# Patient Record
Sex: Female | Born: 1960 | Race: White | Hispanic: No | Marital: Married | Smoking: Never smoker
Health system: Southern US, Community
[De-identification: ages and names within clinical notes are randomized; demographics above are authoritative.]

## PROBLEM LIST (undated history)

## (undated) DIAGNOSIS — E785 Hyperlipidemia, unspecified: Secondary | ICD-10-CM

## (undated) DIAGNOSIS — M722 Plantar fascial fibromatosis: Secondary | ICD-10-CM

## (undated) DIAGNOSIS — E559 Vitamin D deficiency, unspecified: Secondary | ICD-10-CM

## (undated) HISTORY — DX: Plantar fascial fibromatosis: M72.2

## (undated) HISTORY — DX: Hyperlipidemia, unspecified: E78.5

## (undated) HISTORY — DX: Vitamin D deficiency, unspecified: E55.9

---

## 2015-10-10 ENCOUNTER — Encounter: Payer: Self-pay | Admitting: Physician Assistant

## 2015-10-10 DIAGNOSIS — Z0000 Encounter for general adult medical examination without abnormal findings: Secondary | ICD-10-CM | POA: Diagnosis not present

## 2015-10-10 DIAGNOSIS — Z01419 Encounter for gynecological examination (general) (routine) without abnormal findings: Secondary | ICD-10-CM | POA: Diagnosis not present

## 2015-10-26 DIAGNOSIS — Z713 Dietary counseling and surveillance: Secondary | ICD-10-CM | POA: Diagnosis not present

## 2016-01-25 DIAGNOSIS — Z713 Dietary counseling and surveillance: Secondary | ICD-10-CM | POA: Diagnosis not present

## 2016-03-21 DIAGNOSIS — Z713 Dietary counseling and surveillance: Secondary | ICD-10-CM | POA: Diagnosis not present

## 2016-04-24 DIAGNOSIS — H40053 Ocular hypertension, bilateral: Secondary | ICD-10-CM | POA: Diagnosis not present

## 2016-05-17 DIAGNOSIS — Z713 Dietary counseling and surveillance: Secondary | ICD-10-CM | POA: Diagnosis not present

## 2016-07-25 DIAGNOSIS — Z713 Dietary counseling and surveillance: Secondary | ICD-10-CM | POA: Diagnosis not present

## 2016-08-17 DIAGNOSIS — Z1231 Encounter for screening mammogram for malignant neoplasm of breast: Secondary | ICD-10-CM | POA: Diagnosis not present

## 2016-09-12 DIAGNOSIS — Z713 Dietary counseling and surveillance: Secondary | ICD-10-CM | POA: Diagnosis not present

## 2016-11-13 ENCOUNTER — Encounter (INDEPENDENT_AMBULATORY_CARE_PROVIDER_SITE_OTHER): Payer: Self-pay

## 2016-11-13 ENCOUNTER — Encounter: Payer: Self-pay | Admitting: Physician Assistant

## 2016-11-13 ENCOUNTER — Ambulatory Visit (INDEPENDENT_AMBULATORY_CARE_PROVIDER_SITE_OTHER): Payer: BLUE CROSS/BLUE SHIELD | Admitting: Physician Assistant

## 2016-11-13 VITALS — BP 140/93 | HR 76 | Temp 97.3°F | Ht 59.0 in | Wt 206.0 lb

## 2016-11-13 DIAGNOSIS — Z01419 Encounter for gynecological examination (general) (routine) without abnormal findings: Secondary | ICD-10-CM

## 2016-11-13 DIAGNOSIS — Z0000 Encounter for general adult medical examination without abnormal findings: Secondary | ICD-10-CM | POA: Diagnosis not present

## 2016-11-13 DIAGNOSIS — R635 Abnormal weight gain: Secondary | ICD-10-CM

## 2016-11-13 LAB — URINALYSIS, COMPLETE
Bilirubin, UA: NEGATIVE
GLUCOSE, UA: NEGATIVE
KETONES UA: NEGATIVE
LEUKOCYTES UA: NEGATIVE
Nitrite, UA: NEGATIVE
PROTEIN UA: NEGATIVE
SPEC GRAV UA: 1.015 (ref 1.005–1.030)
Urobilinogen, Ur: 0.2 mg/dL (ref 0.2–1.0)
pH, UA: 5.5 (ref 5.0–7.5)

## 2016-11-13 LAB — MICROSCOPIC EXAMINATION
Bacteria, UA: NONE SEEN
Renal Epithel, UA: NONE SEEN /hpf
WBC, UA: NONE SEEN /hpf (ref 0–?)

## 2016-11-13 LAB — BAYER DCA HB A1C WAIVED: HB A1C: 5.6 % (ref <7.0)

## 2016-11-13 MED ORDER — CETIRIZINE HCL 10 MG PO TABS: 10.0000 mg | ORAL_TABLET | Freq: Every day | ORAL | 12 refills | Status: DC

## 2016-11-13 MED ORDER — PREMPRO 0.625-5 MG PO TABS: 1.0000 | ORAL_TABLET | Freq: Every day | ORAL | 11 refills | Status: DC

## 2016-11-13 MED ORDER — VITAMIN D (ERGOCALCIFEROL) 1.25 MG (50000 UNIT) PO CAPS: 50000.0000 [IU] | ORAL_CAPSULE | ORAL | 12 refills | Status: DC

## 2016-11-13 MED ORDER — SIMVASTATIN 80 MG PO TABS: 80.0000 mg | ORAL_TABLET | Freq: Every evening | ORAL | 11 refills | Status: DC

## 2016-11-13 MED ORDER — PHENTERMINE HCL 37.5 MG PO CAPS: 37.5000 mg | ORAL_CAPSULE | ORAL | 2 refills | Status: DC

## 2016-11-13 NOTE — Patient Instructions (Signed)
Health Maintenance for Postmenopausal Women Menopause is a normal process in which your reproductive ability comes to an end. This process happens gradually over a span of months to years, usually between the ages of 22 and 9. Menopause is complete when you have missed 12 consecutive menstrual periods. It is important to talk with your health care provider about some of the most common conditions that affect postmenopausal women, such as heart disease, cancer, and bone loss (osteoporosis). Adopting a healthy lifestyle and getting preventive care can help to promote your health and wellness. Those actions can also lower your chances of developing some of these common conditions. What should I know about menopause? During menopause, you may experience a number of symptoms, such as:  Moderate-to-severe hot flashes.  Night sweats.  Decrease in sex drive.  Mood swings.  Headaches.  Tiredness.  Irritability.  Memory problems.  Insomnia.  Choosing to treat or not to treat menopausal changes is an individual decision that you make with your health care provider. What should I know about hormone replacement therapy and supplements? Hormone therapy products are effective for treating symptoms that are associated with menopause, such as hot flashes and night sweats. Hormone replacement carries certain risks, especially as you become older. If you are thinking about using estrogen or estrogen with progestin treatments, discuss the benefits and risks with your health care provider. What should I know about heart disease and stroke? Heart disease, heart attack, and stroke become more likely as you age. This may be due, in part, to the hormonal changes that your body experiences during menopause. These can affect how your body processes dietary fats, triglycerides, and cholesterol. Heart attack and stroke are both medical emergencies. There are many things that you can do to help prevent heart disease  and stroke:  Have your blood pressure checked at least every 1-2 years. High blood pressure causes heart disease and increases the risk of stroke.  If you are 53-22 years old, ask your health care provider if you should take aspirin to prevent a heart attack or a stroke.  Do not use any tobacco products, including cigarettes, chewing tobacco, or electronic cigarettes. If you need help quitting, ask your health care provider.  It is important to eat a healthy diet and maintain a healthy weight. ? Be sure to include plenty of vegetables, fruits, low-fat dairy products, and lean protein. ? Avoid eating foods that are high in solid fats, added sugars, or salt (sodium).  Get regular exercise. This is one of the most important things that you can do for your health. ? Try to exercise for at least 150 minutes each week. The type of exercise that you do should increase your heart rate and make you sweat. This is known as moderate-intensity exercise. ? Try to do strengthening exercises at least twice each week. Do these in addition to the moderate-intensity exercise.  Know your numbers.Ask your health care provider to check your cholesterol and your blood glucose. Continue to have your blood tested as directed by your health care provider.  What should I know about cancer screening? There are several types of cancer. Take the following steps to reduce your risk and to catch any cancer development as early as possible. Breast Cancer  Practice breast self-awareness. ? This means understanding how your breasts normally appear and feel. ? It also means doing regular breast self-exams. Let your health care provider know about any changes, no matter how small.  If you are 40  or older, have a clinician do a breast exam (clinical breast exam or CBE) every year. Depending on your age, family history, and medical history, it may be recommended that you also have a yearly breast X-ray (mammogram).  If you  have a family history of breast cancer, talk with your health care provider about genetic screening.  If you are at high risk for breast cancer, talk with your health care provider about having an MRI and a mammogram every year.  Breast cancer (BRCA) gene test is recommended for women who have family members with BRCA-related cancers. Results of the assessment will determine the need for genetic counseling and BRCA1 and for BRCA2 testing. BRCA-related cancers include these types: ? Breast. This occurs in males or females. ? Ovarian. ? Tubal. This may also be called fallopian tube cancer. ? Cancer of the abdominal or pelvic lining (peritoneal cancer). ? Prostate. ? Pancreatic.  Cervical, Uterine, and Ovarian Cancer Your health care provider may recommend that you be screened regularly for cancer of the pelvic organs. These include your ovaries, uterus, and vagina. This screening involves a pelvic exam, which includes checking for microscopic changes to the surface of your cervix (Pap test).  For women ages 21-65, health care providers may recommend a pelvic exam and a Pap test every three years. For women ages 79-65, they may recommend the Pap test and pelvic exam, combined with testing for human papilloma virus (HPV), every five years. Some types of HPV increase your risk of cervical cancer. Testing for HPV may also be done on women of any age who have unclear Pap test results.  Other health care providers may not recommend any screening for nonpregnant women who are considered low risk for pelvic cancer and have no symptoms. Ask your health care provider if a screening pelvic exam is right for you.  If you have had past treatment for cervical cancer or a condition that could lead to cancer, you need Pap tests and screening for cancer for at least 20 years after your treatment. If Pap tests have been discontinued for you, your risk factors (such as having a new sexual partner) need to be  reassessed to determine if you should start having screenings again. Some women have medical problems that increase the chance of getting cervical cancer. In these cases, your health care provider may recommend that you have screening and Pap tests more often.  If you have a family history of uterine cancer or ovarian cancer, talk with your health care provider about genetic screening.  If you have vaginal bleeding after reaching menopause, tell your health care provider.  There are currently no reliable tests available to screen for ovarian cancer.  Lung Cancer Lung cancer screening is recommended for adults 69-62 years old who are at high risk for lung cancer because of a history of smoking. A yearly low-dose CT scan of the lungs is recommended if you:  Currently smoke.  Have a history of at least 30 pack-years of smoking and you currently smoke or have quit within the past 15 years. A pack-year is smoking an average of one pack of cigarettes per day for one year.  Yearly screening should:  Continue until it has been 15 years since you quit.  Stop if you develop a health problem that would prevent you from having lung cancer treatment.  Colorectal Cancer  This type of cancer can be detected and can often be prevented.  Routine colorectal cancer screening usually begins at  age 42 and continues through age 45.  If you have risk factors for colon cancer, your health care provider may recommend that you be screened at an earlier age.  If you have a family history of colorectal cancer, talk with your health care provider about genetic screening.  Your health care provider may also recommend using home test kits to check for hidden blood in your stool.  A small camera at the end of a tube can be used to examine your colon directly (sigmoidoscopy or colonoscopy). This is done to check for the earliest forms of colorectal cancer.  Direct examination of the colon should be repeated every  5-10 years until age 71. However, if early forms of precancerous polyps or small growths are found or if you have a family history or genetic risk for colorectal cancer, you may need to be screened more often.  Skin Cancer  Check your skin from head to toe regularly.  Monitor any moles. Be sure to tell your health care provider: ? About any new moles or changes in moles, especially if there is a change in a mole's shape or color. ? If you have a mole that is larger than the size of a pencil eraser.  If any of your family members has a history of skin cancer, especially at a young age, talk with your health care provider about genetic screening.  Always use sunscreen. Apply sunscreen liberally and repeatedly throughout the day.  Whenever you are outside, protect yourself by wearing long sleeves, pants, a wide-brimmed hat, and sunglasses.  What should I know about osteoporosis? Osteoporosis is a condition in which bone destruction happens more quickly than new bone creation. After menopause, you may be at an increased risk for osteoporosis. To help prevent osteoporosis or the bone fractures that can happen because of osteoporosis, the following is recommended:  If you are 46-71 years old, get at least 1,000 mg of calcium and at least 600 mg of vitamin D per day.  If you are older than age 55 but younger than age 65, get at least 1,200 mg of calcium and at least 600 mg of vitamin D per day.  If you are older than age 54, get at least 1,200 mg of calcium and at least 800 mg of vitamin D per day.  Smoking and excessive alcohol intake increase the risk of osteoporosis. Eat foods that are rich in calcium and vitamin D, and do weight-bearing exercises several times each week as directed by your health care provider. What should I know about how menopause affects my mental health? Depression may occur at any age, but it is more common as you become older. Common symptoms of depression  include:  Low or sad mood.  Changes in sleep patterns.  Changes in appetite or eating patterns.  Feeling an overall lack of motivation or enjoyment of activities that you previously enjoyed.  Frequent crying spells.  Talk with your health care provider if you think that you are experiencing depression. What should I know about immunizations? It is important that you get and maintain your immunizations. These include:  Tetanus, diphtheria, and pertussis (Tdap) booster vaccine.  Influenza every year before the flu season begins.  Pneumonia vaccine.  Shingles vaccine.  Your health care provider may also recommend other immunizations. This information is not intended to replace advice given to you by your health care provider. Make sure you discuss any questions you have with your health care provider. Document Released: 07/13/2005  Document Revised: 12/09/2015 Document Reviewed: 02/22/2015 Elsevier Interactive Patient Education  2018 [employer]  

## 2016-11-14 LAB — CMP14+EGFR
A/G RATIO: 1.8 (ref 1.2–2.2)
ALBUMIN: 4.2 g/dL (ref 3.5–5.5)
ALT: 17 IU/L (ref 0–32)
AST: 18 IU/L (ref 0–40)
Alkaline Phosphatase: 91 IU/L (ref 39–117)
BILIRUBIN TOTAL: 0.3 mg/dL (ref 0.0–1.2)
BUN / CREAT RATIO: 15 (ref 9–23)
BUN: 12 mg/dL (ref 6–24)
CHLORIDE: 103 mmol/L (ref 96–106)
CO2: 24 mmol/L (ref 20–29)
Calcium: 9.4 mg/dL (ref 8.7–10.2)
Creatinine, Ser: 0.8 mg/dL (ref 0.57–1.00)
GFR calc non Af Amer: 84 mL/min/{1.73_m2} (ref >59)
GFR, EST AFRICAN AMERICAN: 97 mL/min/{1.73_m2} (ref >59)
Globulin, Total: 2.4 g/dL (ref 1.5–4.5)
Glucose: 97 mg/dL (ref 65–99)
POTASSIUM: 4.5 mmol/L (ref 3.5–5.2)
Sodium: 142 mmol/L (ref 134–144)
TOTAL PROTEIN: 6.6 g/dL (ref 6.0–8.5)

## 2016-11-14 LAB — LIPID PANEL
Chol/HDL Ratio: 4.9 ratio — ABNORMAL HIGH (ref 0.0–4.4)
Cholesterol, Total: 202 mg/dL — ABNORMAL HIGH (ref 100–199)
HDL: 41 mg/dL (ref >39)
LDL Calculated: 109 mg/dL — ABNORMAL HIGH (ref 0–99)
Triglycerides: 259 mg/dL — ABNORMAL HIGH (ref 0–149)
VLDL Cholesterol Cal: 52 mg/dL — ABNORMAL HIGH (ref 5–40)

## 2016-11-14 LAB — PAP IG W/ RFLX HPV ASCU: PAP SMEAR COMMENT: 0

## 2016-11-14 LAB — CBC WITH DIFFERENTIAL/PLATELET
BASOS: 0 %
Basophils Absolute: 0 10*3/uL (ref 0.0–0.2)
EOS (ABSOLUTE): 0.2 10*3/uL (ref 0.0–0.4)
Eos: 2 %
Hematocrit: 39.9 % (ref 34.0–46.6)
Hemoglobin: 13.5 g/dL (ref 11.1–15.9)
IMMATURE GRANS (ABS): 0 10*3/uL (ref 0.0–0.1)
Immature Granulocytes: 0 %
LYMPHS ABS: 2.1 10*3/uL (ref 0.7–3.1)
LYMPHS: 33 %
MCH: 30.1 pg (ref 26.6–33.0)
MCHC: 33.8 g/dL (ref 31.5–35.7)
MCV: 89 fL (ref 79–97)
Monocytes Absolute: 0.5 10*3/uL (ref 0.1–0.9)
Monocytes: 8 %
NEUTROS ABS: 3.6 10*3/uL (ref 1.4–7.0)
Neutrophils: 57 %
PLATELETS: 272 10*3/uL (ref 150–379)
RBC: 4.49 x10E6/uL (ref 3.77–5.28)
RDW: 13.6 % (ref 12.3–15.4)
WBC: 6.4 10*3/uL (ref 3.4–10.8)

## 2016-11-14 LAB — TSH: TSH: 2.16 u[IU]/mL (ref 0.450–4.500)

## 2016-11-14 NOTE — Progress Notes (Signed)
BP (!) 140/93   Pulse 76   Temp 97.3 F (36.3 C) (Oral)   Ht _0  (1.499 m)   Wt 206 lb (93.4 kg)   BMI 41.61 kg/m    Subjective:    Patient ID: Crystal Hall, female    DOB: 01-Sep-1960, 56 y.o.   MRN: [id number]  HPI: Bonny Vanleeuwen is a 56 y.o. female presenting on 11/13/2016 for Annual Exam  This patient comes in for Annual wellness visit and recheck on medications and conditions including She comes in for an annual exam. She needs a complete physical performed today with Pap smear. She also like her medications refilled and she has had some weight gain..   All medications are reviewed today. There are no reports of any problems with the medications. All of the medical conditions are reviewed and updated.  Lab work is reviewed and will be ordered as medically necessary. There are no new problems reported with today's visit.   Relevant past medical, surgical, family and social history reviewed and updated as indicated. Allergies and medications reviewed and updated.  No past medical history on file.  No past surgical history on file.  Review of Systems  Constitutional: Negative.  Negative for activity change, fatigue and fever.  HENT: Negative.   Eyes: Negative.   Respiratory: Negative.  Negative for cough.   Cardiovascular: Negative.  Negative for chest pain.  Gastrointestinal: Negative.  Negative for abdominal pain.  Endocrine: Negative.   Genitourinary: Negative.  Negative for dysuria.  Musculoskeletal: Negative.   Skin: Negative.   Neurological: Negative.     Allergies as of 11/13/2016   No Known Allergies     Medication List       Accurate as of 11/13/16 11:59 PM. Always use your most recent med list.          cetirizine 10 MG tablet Commonly known as:  ZYRTEC Take 1 tablet (10 mg total) by mouth daily.   phentermine 37.5 MG capsule Take 1 capsule (37.5 mg total) by mouth every morning.   PREMPRO 0.625-5 MG tablet Generic drug:  estrogen  (conjugated)-medroxyprogesterone Take 1 tablet by mouth daily.   simvastatin 80 MG tablet Commonly known as:  ZOCOR Take 1 tablet (80 mg total) by mouth every evening.   Vitamin D (Ergocalciferol) 50000 units Caps capsule Commonly known as:  DRISDOL Take 1 capsule (50,000 Units total) by mouth every 7 (seven) days.          Objective:    BP (!) 140/93   Pulse 76   Temp 97.3 F (36.3 C) (Oral)   Ht _1  (1.499 m)   Wt 206 lb (93.4 kg)   BMI 41.61 kg/m   No Known Allergies  Physical Exam  Constitutional: She is oriented to person, place, and time. She appears well-developed and well-nourished.  HENT:  Head: Normocephalic and atraumatic.  Eyes: Conjunctivae and EOM are normal. Pupils are equal, round, and reactive to light.  Neck: Normal range of motion. Neck supple.  Cardiovascular: Normal rate, regular rhythm, normal heart sounds and intact distal pulses.   Pulmonary/Chest: Effort normal and breath sounds normal. Right breast exhibits no mass, no skin change and no tenderness. Left breast exhibits no mass, no skin change and no tenderness. Breasts are symmetrical.  Abdominal: Soft. Bowel sounds are normal.  Genitourinary: Vagina normal and uterus normal. Rectal exam shows no fissure. No breast swelling, tenderness, discharge or bleeding. There is no tenderness or lesion on the right  labia. There is no tenderness or lesion on the left labia. Uterus is not deviated, not enlarged and not tender. Cervix exhibits no motion tenderness, no discharge and no friability. Right adnexum displays no mass, no tenderness and no fullness. Left adnexum displays no mass, no tenderness and no fullness. No tenderness or bleeding in the vagina. No vaginal discharge found.  Neurological: She is alert and oriented to person, place, and time. She has normal reflexes.  Skin: Skin is warm and dry. No rash noted.  Psychiatric: She has a normal mood and affect. Her behavior is normal. Judgment and  thought content normal.    Results for orders placed or performed in visit on 11/13/16  Microscopic Examination  Result Value Ref Range   WBC, UA None seen 0 - 5 /hpf   RBC, UA 0-2 0 - 2 /hpf   Epithelial Cells (non renal) 0-10 0 - 10 /hpf   Renal Epithel, UA None seen None seen /hpf   Bacteria, UA None seen None seen/Few  Urinalysis, Complete  Result Value Ref Range   Specific Gravity, UA 1.015 1.005 - 1.030   pH, UA 5.5 5.0 - 7.5   Color, UA Yellow Yellow   Appearance Ur Clear Clear   Leukocytes, UA Negative Negative   Protein, UA Negative Negative/Trace   Glucose, UA Negative Negative   Ketones, UA Negative Negative   RBC, UA Trace (A) Negative   Bilirubin, UA Negative Negative   Urobilinogen, Ur 0.2 0.2 - 1.0 mg/dL   Nitrite, UA Negative Negative   Microscopic Examination See below:   CBC with Differential/Platelet  Result Value Ref Range   WBC 6.4 3.4 - 10.8 x10E3/uL   RBC 4.49 3.77 - 5.28 x10E6/uL   Hemoglobin 13.5 11.1 - 15.9 g/dL   Hematocrit 39.9 34.0 - 46.6 %   MCV 89 79 - 97 fL   MCH 30.1 26.6 - 33.0 pg   MCHC 33.8 31.5 - 35.7 g/dL   RDW 13.6 12.3 - 15.4 %   Platelets 272 150 - 379 x10E3/uL   Neutrophils 57 Not Estab. %   Lymphs 33 Not Estab. %   Monocytes 8 Not Estab. %   Eos 2 Not Estab. %   Basos 0 Not Estab. %   Neutrophils Absolute 3.6 1.4 - 7.0 x10E3/uL   Lymphocytes Absolute 2.1 0.7 - 3.1 x10E3/uL   Monocytes Absolute 0.5 0.1 - 0.9 x10E3/uL   EOS (ABSOLUTE) 0.2 0.0 - 0.4 x10E3/uL   Basophils Absolute 0.0 0.0 - 0.2 x10E3/uL   Immature Granulocytes 0 Not Estab. %   Immature Grans (Abs) 0.0 0.0 - 0.1 x10E3/uL  CMP14+EGFR  Result Value Ref Range   Glucose 97 65 - 99 mg/dL   BUN 12 6 - 24 mg/dL   Creatinine, Ser 0.80 0.57 - 1.00 mg/dL   GFR calc non Af Amer 84 >59 mL/min/1.73   GFR calc Af Amer 97 >59 mL/min/1.73   BUN/Creatinine Ratio 15 9 - 23   Sodium 142 134 - 144 mmol/L   Potassium 4.5 3.5 - 5.2 mmol/L   Chloride 103 96 - 106 mmol/L   CO2  24 20 - 29 mmol/L   Calcium 9.4 8.7 - 10.2 mg/dL   Total Protein 6.6 6.0 - 8.5 g/dL   Albumin 4.2 3.5 - 5.5 g/dL   Globulin, Total 2.4 1.5 - 4.5 g/dL   Albumin/Globulin Ratio 1.8 1.2 - 2.2   Bilirubin Total 0.3 0.0 - 1.2 mg/dL   Alkaline Phosphatase 91 39 -  117 IU/L   AST 18 0 - 40 IU/L   ALT 17 0 - 32 IU/L  Lipid panel  Result Value Ref Range   Cholesterol, Total 202 (H) 100 - 199 mg/dL   Triglycerides 259 (H) 0 - 149 mg/dL   HDL 41 >39 mg/dL   VLDL Cholesterol Cal 52 (H) 5 - 40 mg/dL   LDL Calculated 109 (H) 0 - 99 mg/dL   Chol/HDL Ratio 4.9 (H) 0.0 - 4.4 ratio  TSH  Result Value Ref Range   TSH 2.160 0.450 - 4.500 uIU/mL  Bayer DCA Hb A1c Waived  Result Value Ref Range   Bayer DCA Hb A1c Waived 5.6 <7.0 %      Assessment & Plan:   1. Annual physical exam - Urinalysis, Complete - CBC with Differential/Platelet - CMP14+EGFR - Lipid panel - TSH - Bayer DCA Hb A1c Waived - Pap IG w/ reflex to HPV when [facility])  2. Well female exam with routine gynecological exam - Pap IG w/ reflex to HPV when [facility])  3. Weight gain - phentermine 37.5 MG capsule; Take 1 capsule (37.5 mg total) by mouth every morning.  Dispense: 30 capsule; Refill: 2   Current Outpatient Prescriptions:  .  cetirizine (ZYRTEC) 10 MG tablet, Take 1 tablet (10 mg total) by mouth daily., Disp: 30 tablet, Rfl: 12 .  phentermine 37.5 MG capsule, Take 1 capsule (37.5 mg total) by mouth every morning., Disp: 30 capsule, Rfl: 2 .  PREMPRO 0.625-5 MG tablet, Take 1 tablet by mouth daily., Disp: 30 tablet, Rfl: 11 .  simvastatin (ZOCOR) 80 MG tablet, Take 1 tablet (80 mg total) by mouth every evening., Disp: 30 tablet, Rfl: 11 .  Vitamin D, Ergocalciferol, (DRISDOL) 50000 units CAPS capsule, Take 1 capsule (50,000 Units total) by mouth every 7 (seven) days., Disp: 4 capsule, Rfl: 12  Continue all other maintenance medications as listed above.  Follow up plan: Return in about 1  year (around 11/13/2017) for recheck.  Educational handout given for health maintenance  Terald Sleeper PA-C [place] [address]  [place], [place] [address] [phone]   11/14/2016, 1:54 PM

## 2016-12-12 DIAGNOSIS — Z713 Dietary counseling and surveillance: Secondary | ICD-10-CM | POA: Diagnosis not present

## 2017-02-13 ENCOUNTER — Other Ambulatory Visit: Payer: Self-pay | Admitting: Physician Assistant

## 2017-02-13 NOTE — Telephone Encounter (Signed)
Rx phoned in.   

## 2017-02-20 DIAGNOSIS — Z713 Dietary counseling and surveillance: Secondary | ICD-10-CM | POA: Diagnosis not present

## 2017-04-10 DIAGNOSIS — Z713 Dietary counseling and surveillance: Secondary | ICD-10-CM | POA: Diagnosis not present

## 2017-04-12 ENCOUNTER — Other Ambulatory Visit: Payer: Self-pay | Admitting: Physician Assistant

## 2017-04-12 NOTE — Telephone Encounter (Signed)
rx called into pharmacy

## 2017-04-12 NOTE — Telephone Encounter (Signed)
Last seen 11/13/16  Lawanna KobusAngel  If approved [facility]  [phone]

## 2017-06-16 DIAGNOSIS — Z79899 Other long term (current) drug therapy: Secondary | ICD-10-CM | POA: Diagnosis not present

## 2017-06-16 DIAGNOSIS — M25512 Pain in left shoulder: Secondary | ICD-10-CM | POA: Diagnosis not present

## 2017-06-16 DIAGNOSIS — F17200 Nicotine dependence, unspecified, uncomplicated: Secondary | ICD-10-CM | POA: Diagnosis not present

## 2017-06-16 DIAGNOSIS — M19012 Primary osteoarthritis, left shoulder: Secondary | ICD-10-CM | POA: Diagnosis not present

## 2017-07-01 ENCOUNTER — Other Ambulatory Visit: Payer: Self-pay | Admitting: Physician Assistant

## 2017-07-01 NOTE — Telephone Encounter (Signed)
Last seen 11/13/16  angel

## 2017-07-31 DIAGNOSIS — Z713 Dietary counseling and surveillance: Secondary | ICD-10-CM | POA: Diagnosis not present

## 2017-08-20 ENCOUNTER — Encounter: Payer: Self-pay | Admitting: Physician Assistant

## 2017-08-20 ENCOUNTER — Ambulatory Visit: Payer: BLUE CROSS/BLUE SHIELD | Admitting: Physician Assistant

## 2017-08-20 VITALS — BP 137/82 | HR 94 | Temp 97.5°F | Ht 59.0 in | Wt 210.0 lb

## 2017-08-20 DIAGNOSIS — L2389 Allergic contact dermatitis due to other agents: Secondary | ICD-10-CM

## 2017-08-20 MED ORDER — METHYLPREDNISOLONE ACETATE 80 MG/ML IJ SUSP
80.0000 mg | Freq: Once | INTRAMUSCULAR | Status: AC
  Administered 2017-08-20: 80 mg via INTRAMUSCULAR

## 2017-08-20 MED ORDER — PREDNISONE 10 MG (21) PO TBPK: ORAL_TABLET | ORAL | 0 refills | Status: DC

## 2017-08-20 NOTE — Patient Instructions (Signed)
Contact Dermatitis Dermatitis is redness, soreness, and swelling (inflammation) of the skin. Contact dermatitis is a reaction to certain substances that touch the skin. You either touched something that irritated your skin, or you have allergies to something you touched. Follow these instructions at home: Skin Care  Moisturize your skin as needed.  Apply cool compresses to the affected areas.  Try taking a bath with: ? Epsom salts. Follow the instructions on the package. You can get these at a pharmacy or grocery store. ? Baking soda. Pour a small amount into the bath as told by your doctor. ? Colloidal oatmeal. Follow the instructions on the package. You can get this at a pharmacy or grocery store.  Try applying baking soda paste to your skin. Stir water into baking soda until it looks like paste.  Do not scratch your skin.  Bathe less often.  Bathe in lukewarm water. Avoid using hot water. Medicines  Take or apply over-the-counter and prescription medicines only as told by your doctor.  If you were prescribed an antibiotic medicine, take or apply your antibiotic as told by your doctor. Do not stop taking the antibiotic even if your condition starts to get better. General instructions  Keep all follow-up visits as told by your doctor. This is important.  Avoid the substance that caused your reaction. If you do not know what caused it, keep a journal to try to track what caused it. Write down: ? What you eat. ? What cosmetic products you use. ? What you drink. ? What you wear in the affected area. This includes jewelry.  If you were given a bandage (dressing), take care of it as told by your doctor. This includes when to change and remove it. Contact a doctor if:  You do not get better with treatment.  Your condition gets worse.  You have signs of infection such as: ? Swelling. ? Tenderness. ? Redness. ? Soreness. ? Warmth.  You have a fever.  You have new  symptoms. Get help right away if:  You have a very bad headache.  You have neck pain.  Your neck is stiff.  You throw up (vomit).  You feel very sleepy.  You see red streaks coming from the affected area.  Your bone or joint underneath the affected area becomes painful after the skin has healed.  The affected area turns darker.  You have trouble breathing. This information is not intended to replace advice given to you by your health care provider. Make sure you discuss any questions you have with your health care provider. Document Released: 03/18/2009 Document Revised: 10/27/2015 Document Reviewed: 10/06/2014 Elsevier Interactive Patient Education  2018 [employer].  

## 2017-08-21 NOTE — Progress Notes (Signed)
BP 137/82   Pulse 94   Temp (!) 97.5 F (36.4 C) (Oral)   Ht 4\' 11"  (1.499 m)   Wt 210 lb (95.3 kg)   BMI 42.41 kg/m    Subjective:    Patient ID: Crystal Hall, female    DOB: [birth year], 57 y.o.   MRN: [id number]  HPI: Crystal Hall is a 57 y.o. female presenting on 08/20/2017 for Rash  Over the weekend patient was outside in no she was exposed to poison him.  She has had severe reactions in the past.  She has a good number of linear areas on her right arm and some on her upper leg.  She denies any fever chills she is not having any swelling of her face or throat.  History reviewed. No pertinent past medical history. Relevant past medical, surgical, family and social history reviewed and updated as indicated. Interim medical history since our last visit reviewed. Allergies and medications reviewed and updated. DATA REVIEWED: CHART IN EPIC  Family History reviewed for pertinent findings.  Review of Systems  Constitutional: Negative.   HENT: Negative.   Eyes: Negative.   Respiratory: Negative.   Gastrointestinal: Negative.   Genitourinary: Negative.     Allergies as of 08/20/2017   No Known Allergies     Medication List        Accurate as of 08/20/17 11:59 PM. Always use your most recent med list.          cetirizine 10 MG tablet Commonly known as:  ZYRTEC Take 1 tablet (10 mg total) by mouth daily.   omega-3 acid ethyl esters 1 g capsule Commonly known as:  LOVAZA TAKE TWO CAPSULES BY MOUTH TWICE DAILY   predniSONE 10 MG (21) Tbpk tablet Commonly known as:  STERAPRED UNI-PAK 21 TAB As directed x 6 days   PREMPRO 0.625-5 MG tablet Generic drug:  estrogen (conjugated)-medroxyprogesterone Take 1 tablet by mouth daily.   simvastatin 80 MG tablet Commonly known as:  ZOCOR Take 1 tablet (80 mg total) by mouth every evening.   Vitamin D (Ergocalciferol) 50000 units Caps capsule Commonly known as:  DRISDOL Take 1 capsule (50,000 Units total) by mouth every 7  (seven) days.          Objective:    BP 137/82   Pulse 94   Temp (!) 97.5 F (36.4 C) (Oral)   Ht 4\' 11"  (1.499 m)   Wt 210 lb (95.3 kg)   BMI 42.41 kg/m   No Known Allergies  Wt Readings from Last 3 Encounters:  08/20/17 210 lb (95.3 kg)  11/13/16 206 lb (93.4 kg)    Physical Exam  Constitutional: She is oriented to person, place, and time. She appears well-developed and well-nourished.  HENT:  Head: Normocephalic and atraumatic.  Eyes: Conjunctivae and EOM are normal. Pupils are equal, round, and reactive to light.  Cardiovascular: Normal rate, regular rhythm, normal heart sounds and intact distal pulses.  Pulmonary/Chest: Effort normal and breath sounds normal.  Abdominal: Soft. Bowel sounds are normal.  Neurological: She is alert and oriented to person, place, and time. She has normal reflexes.  Skin: Skin is warm and dry. No rash noted.  Linear configurations of blisters on a red base.  Excoriation.  Psychiatric: She has a normal mood and affect. Her behavior is normal. Judgment and thought content normal.        Assessment & Plan:   1. 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  Follow up plan: Return if symptoms worsen or fail to improve.  Educational handout given for survey  Remus LofflerAngel S. Diondre Pulis PA-C [facility] [address]  [place], [place] [id number] [phone]   08/21/2017, 4:07 PM

## 2017-10-09 DIAGNOSIS — Z713 Dietary counseling and surveillance: Secondary | ICD-10-CM | POA: Diagnosis not present

## 2017-10-22 ENCOUNTER — Telehealth: Payer: Self-pay | Admitting: Physician Assistant

## 2017-10-30 ENCOUNTER — Telehealth: Payer: Self-pay | Admitting: Physician Assistant

## 2017-10-30 MED ORDER — FLUCONAZOLE 150 MG PO TABS: 150.0000 mg | ORAL_TABLET | Freq: Once | ORAL | 0 refills | Status: AC

## 2017-10-30 NOTE — Telephone Encounter (Signed)
RX sent to pharmacy per pt request Okayed per Dr Dettinger Left message on pt's vm

## 2017-10-30 NOTE — Telephone Encounter (Signed)
Go ahead and send Diflucan 150 mg x 1 0 refill

## 2017-11-15 ENCOUNTER — Other Ambulatory Visit: Payer: Self-pay | Admitting: Physician Assistant

## 2017-11-18 ENCOUNTER — Other Ambulatory Visit: Payer: Self-pay | Admitting: Physician Assistant

## 2017-11-27 ENCOUNTER — Other Ambulatory Visit: Payer: Self-pay | Admitting: Physician Assistant

## 2017-11-27 ENCOUNTER — Telehealth: Payer: Self-pay | Admitting: Physician Assistant

## 2017-11-27 DIAGNOSIS — Z0000 Encounter for general adult medical examination without abnormal findings: Secondary | ICD-10-CM

## 2017-11-27 NOTE — Telephone Encounter (Signed)
Orders are ready

## 2017-11-29 ENCOUNTER — Ambulatory Visit (INDEPENDENT_AMBULATORY_CARE_PROVIDER_SITE_OTHER): Payer: BLUE CROSS/BLUE SHIELD | Admitting: Physician Assistant

## 2017-11-29 ENCOUNTER — Encounter: Payer: Self-pay | Admitting: Physician Assistant

## 2017-11-29 VITALS — BP 139/82 | HR 87 | Temp 97.6°F | Ht 59.0 in | Wt 214.4 lb

## 2017-11-29 DIAGNOSIS — R635 Abnormal weight gain: Secondary | ICD-10-CM

## 2017-11-29 DIAGNOSIS — Z1211 Encounter for screening for malignant neoplasm of colon: Secondary | ICD-10-CM

## 2017-11-29 DIAGNOSIS — Z780 Asymptomatic menopausal state: Secondary | ICD-10-CM

## 2017-11-29 DIAGNOSIS — Z0000 Encounter for general adult medical examination without abnormal findings: Secondary | ICD-10-CM

## 2017-11-29 MED ORDER — CONJ ESTROG-MEDROXYPROGEST ACE 0.3-1.5 MG PO TABS: 1.0000 | ORAL_TABLET | Freq: Every day | ORAL | 5 refills | Status: DC

## 2017-11-29 MED ORDER — PHENTERMINE HCL 37.5 MG PO TABS: 37.5000 mg | ORAL_TABLET | Freq: Every day | ORAL | 5 refills | Status: DC

## 2017-11-29 NOTE — Patient Instructions (Signed)
Breakfast: eggs 2-3 Or greek yogurt low fat DANNON 1 slice delightful [employer] bread  Lunch: 2 slice [employer] delightfully bread       Or Nature's Own Light 4 ounces chicken, turkey, roast beef 1 slice Thin sliced cheese Sargento Mustard ok 1 piece of fruit  Supper: 6 ounces lean meat 2 cups raw/cooked veg or 1 cup pintos, corn lima  3 snacks 100 calories or less  

## 2017-11-29 NOTE — Progress Notes (Signed)
BP 139/82   Pulse 87   Temp 97.6 F (36.4 C) (Oral)   Ht '4\' 11"'  (1.499 m)   Wt 214 lb 6.4 oz (97.3 kg)   BMI 43.30 kg/m    Subjective:    Patient ID: Crystal Hall, female    DOB: 26-Jul-1960, 57 y.o.   MRN: [id number]  HPI: Aerabella Galasso is a 57 y.o. female presenting on 11/29/2017 for Annual Exam  This patient comes in for annual well physical examination. All medications are reviewed today. There are no reports of any problems with the medications. All of the medical conditions are reviewed and updated.  Lab work is reviewed and will be ordered as medically necessary. There are no new problems reported with today's visit.  Patient reports doing well overall.  She would like to start reducing her Prempro to a lower dose and then come off.  So I am going to send in prescriptions for her to take half dose and then I have encouraged her to go to every other day over the next 3 months.  She would like to have a Cologuard performed for colon screening.  Her labs were performed through her work order.  We anticipate to receive them back to fax once they are completed.  History reviewed. No pertinent past medical history. Relevant past medical, surgical, family and social history reviewed and updated as indicated. Interim medical history since our last visit reviewed. Allergies and medications reviewed and updated. DATA REVIEWED: CHART IN EPIC  Family History reviewed for pertinent findings.  Review of Systems  Constitutional: Positive for unexpected weight change. Negative for activity change, fatigue and fever.  HENT: Negative.   Eyes: Negative.   Respiratory: Negative.  Negative for cough.   Cardiovascular: Negative.  Negative for chest pain.  Gastrointestinal: Negative.  Negative for abdominal pain.  Endocrine: Negative.   Genitourinary: Negative.  Negative for dysuria.  Musculoskeletal: Negative.   Skin: Negative.   Neurological: Negative.     Allergies as of 11/29/2017   No  Known Allergies     Medication List        Accurate as of 11/29/17  4:23 PM. Always use your most recent med list.          cetirizine 10 MG tablet Commonly known as:  ZYRTEC TAKE ONE TABLET BY MOUTH DAILY   estrogen (conjugated)-medroxyprogesterone 0.3-1.5 MG tablet Commonly known as:  PREMPRO Take 1 tablet by mouth daily.   omega-3 acid ethyl esters 1 g capsule Commonly known as:  LOVAZA TAKE TWO CAPSULES BY MOUTH TWICE DAILY   phentermine 37.5 MG tablet Commonly known as:  ADIPEX-P Take 1 tablet (37.5 mg total) by mouth daily before breakfast.   simvastatin 80 MG tablet Commonly known as:  ZOCOR TAKE ONE TABLET BY MOUTH EVERY EVENING   Vitamin D (Ergocalciferol) 50000 units Caps capsule Commonly known as:  DRISDOL TAKE ONE CAPSULE BY MOUTH EVERY 7 DAYS          Objective:    BP 139/82   Pulse 87   Temp 97.6 F (36.4 C) (Oral)   Ht '4\' 11"'  (1.499 m)   Wt 214 lb 6.4 oz (97.3 kg)   BMI 43.30 kg/m   No Known Allergies  Wt Readings from Last 3 Encounters:  11/29/17 214 lb 6.4 oz (97.3 kg)  08/20/17 210 lb (95.3 kg)  11/13/16 206 lb (93.4 kg)    Physical Exam  Constitutional: She is oriented to person, place, and time. She appears  well-developed and well-nourished.  HENT:  Head: Normocephalic and atraumatic.  Eyes: Pupils are equal, round, and reactive to light. Conjunctivae and EOM are normal.  Neck: Normal range of motion. Neck supple.  Cardiovascular: Normal rate, regular rhythm, normal heart sounds and intact distal pulses.  Pulmonary/Chest: Effort normal and breath sounds normal. Right breast exhibits no mass, no skin change and no tenderness. Left breast exhibits no mass, no skin change and no tenderness. No breast tenderness, discharge or bleeding. Breasts are symmetrical.  Abdominal: Soft. Bowel sounds are normal.  Genitourinary: Vagina normal and uterus normal. Rectal exam shows no fissure. No breast tenderness, discharge or bleeding. There is no  tenderness or lesion on the right labia. There is no tenderness or lesion on the left labia. Uterus is not deviated, not enlarged and not tender. Cervix exhibits no motion tenderness, no discharge and no friability. Right adnexum displays no mass, no tenderness and no fullness. Left adnexum displays no mass, no tenderness and no fullness. No tenderness or bleeding in the vagina. No vaginal discharge found.  Neurological: She is alert and oriented to person, place, and time. She has normal reflexes.  Skin: Skin is warm and dry. No rash noted.  Psychiatric: She has a normal mood and affect. Her behavior is normal. Judgment and thought content normal.        Assessment & Plan:   1. Annual physical exam - VITAMIN D 25 Hydroxy (Vit-D Deficiency, Fractures) - TSH - Lipid panel - CMP14+EGFR - CBC with Differential/Platelet - Cologuard  2. Screening for colon cancer - Cologuard  3. Menopause - estrogen, conjugated,-medroxyprogesterone (PREMPRO) 0.3-1.5 MG tablet; Take 1 tablet by mouth daily.  Dispense: 30 tablet; Refill: 5  4. Weight gain - phentermine (ADIPEX-P) 37.5 MG tablet; Take 1 tablet (37.5 mg total) by mouth daily before breakfast.  Dispense: 30 tablet; Refill: 5   Continue all other maintenance medications as listed above.  Follow up plan: Return in about 6 months (around 05/31/2018) for recheck.  Educational handout given for diet 1500 calorie  Terald Sleeper PA-C [facility] [address]  [place], [place] [address] [phone]   11/29/2017, 4:23 PM

## 2017-11-30 LAB — CMP14+EGFR
A/G RATIO: 1.9 (ref 1.2–2.2)
ALK PHOS: 87 IU/L (ref 39–117)
ALT: 14 IU/L (ref 0–32)
AST: 12 IU/L (ref 0–40)
Albumin: 4 g/dL (ref 3.5–5.5)
BILIRUBIN TOTAL: 0.3 mg/dL (ref 0.0–1.2)
BUN/Creatinine Ratio: 19 (ref 9–23)
BUN: 16 mg/dL (ref 6–24)
CHLORIDE: 102 mmol/L (ref 96–106)
CO2: 24 mmol/L (ref 20–29)
Calcium: 8.9 mg/dL (ref 8.7–10.2)
Creatinine, Ser: 0.85 mg/dL (ref 0.57–1.00)
GFR calc non Af Amer: 77 mL/min/{1.73_m2} (ref >59)
GFR, EST AFRICAN AMERICAN: 89 mL/min/{1.73_m2} (ref >59)
Globulin, Total: 2.1 g/dL (ref 1.5–4.5)
Glucose: 103 mg/dL — ABNORMAL HIGH (ref 65–99)
POTASSIUM: 4.3 mmol/L (ref 3.5–5.2)
Sodium: 141 mmol/L (ref 134–144)
Total Protein: 6.1 g/dL (ref 6.0–8.5)

## 2017-11-30 LAB — CBC WITH DIFFERENTIAL/PLATELET
BASOS ABS: 0 10*3/uL (ref 0.0–0.2)
Basos: 0 %
EOS (ABSOLUTE): 0.2 10*3/uL (ref 0.0–0.4)
Eos: 2 %
Hematocrit: 41.1 % (ref 34.0–46.6)
Hemoglobin: 13.6 g/dL (ref 11.1–15.9)
IMMATURE GRANULOCYTES: 0 %
Immature Grans (Abs): 0 10*3/uL (ref 0.0–0.1)
Lymphocytes Absolute: 2.4 10*3/uL (ref 0.7–3.1)
Lymphs: 31 %
MCH: 30.8 pg (ref 26.6–33.0)
MCHC: 33.1 g/dL (ref 31.5–35.7)
MCV: 93 fL (ref 79–97)
Monocytes Absolute: 0.5 10*3/uL (ref 0.1–0.9)
Monocytes: 7 %
NEUTROS PCT: 60 %
Neutrophils Absolute: 4.6 10*3/uL (ref 1.4–7.0)
PLATELETS: 246 10*3/uL (ref 150–450)
RBC: 4.41 x10E6/uL (ref 3.77–5.28)
RDW: 13.7 % (ref 12.3–15.4)
WBC: 7.7 10*3/uL (ref 3.4–10.8)

## 2017-11-30 LAB — LIPID PANEL
Chol/HDL Ratio: 3.7 ratio (ref 0.0–4.4)
Cholesterol, Total: 150 mg/dL (ref 100–199)
HDL: 41 mg/dL (ref >39)
LDL CALC: 82 mg/dL (ref 0–99)
TRIGLYCERIDES: 133 mg/dL (ref 0–149)
VLDL CHOLESTEROL CAL: 27 mg/dL (ref 5–40)

## 2017-11-30 LAB — VITAMIN D 25 HYDROXY (VIT D DEFICIENCY, FRACTURES): VIT D 25 HYDROXY: 74.2 ng/mL (ref 30.0–100.0)

## 2017-11-30 LAB — TSH: TSH: 2.7 u[IU]/mL (ref 0.450–4.500)

## 2017-12-31 DIAGNOSIS — Z1231 Encounter for screening mammogram for malignant neoplasm of breast: Secondary | ICD-10-CM | POA: Diagnosis not present

## 2018-01-02 ENCOUNTER — Other Ambulatory Visit: Payer: Self-pay | Admitting: Physician Assistant

## 2018-01-02 DIAGNOSIS — Z1231 Encounter for screening mammogram for malignant neoplasm of breast: Secondary | ICD-10-CM

## 2018-01-08 ENCOUNTER — Other Ambulatory Visit: Payer: Self-pay | Admitting: *Deleted

## 2018-01-08 DIAGNOSIS — Z713 Dietary counseling and surveillance: Secondary | ICD-10-CM | POA: Diagnosis not present

## 2018-01-08 DIAGNOSIS — Z1231 Encounter for screening mammogram for malignant neoplasm of breast: Secondary | ICD-10-CM

## 2018-03-26 DIAGNOSIS — Z713 Dietary counseling and surveillance: Secondary | ICD-10-CM | POA: Diagnosis not present

## 2018-06-04 HISTORY — PX: OTHER SURGICAL HISTORY: SHX169

## 2018-06-07 ENCOUNTER — Other Ambulatory Visit: Payer: Self-pay | Admitting: Physician Assistant

## 2018-06-07 DIAGNOSIS — Z780 Asymptomatic menopausal state: Secondary | ICD-10-CM

## 2018-06-07 DIAGNOSIS — R635 Abnormal weight gain: Secondary | ICD-10-CM

## 2018-06-30 ENCOUNTER — Encounter: Payer: Self-pay | Admitting: Family Medicine

## 2018-06-30 ENCOUNTER — Ambulatory Visit: Payer: BLUE CROSS/BLUE SHIELD | Admitting: Family Medicine

## 2018-06-30 VITALS — BP 135/87 | HR 106 | Temp 101.5°F | Ht 59.0 in | Wt 202.0 lb

## 2018-06-30 DIAGNOSIS — J101 Influenza due to other identified influenza virus with other respiratory manifestations: Secondary | ICD-10-CM | POA: Diagnosis not present

## 2018-06-30 DIAGNOSIS — R509 Fever, unspecified: Secondary | ICD-10-CM

## 2018-06-30 DIAGNOSIS — R6889 Other general symptoms and signs: Secondary | ICD-10-CM

## 2018-06-30 LAB — VERITOR FLU A/B WAIVED
INFLUENZA A: NEGATIVE
INFLUENZA B: POSITIVE — AB

## 2018-06-30 MED ORDER — ACETAMINOPHEN 500 MG PO TABS
500.0000 mg | ORAL_TABLET | Freq: Once | ORAL | Status: AC
  Administered 2018-06-30: 500 mg via ORAL

## 2018-06-30 MED ORDER — OSELTAMIVIR PHOSPHATE 75 MG PO CAPS: 75.0000 mg | ORAL_CAPSULE | Freq: Two times a day (BID) | ORAL | 0 refills | Status: AC

## 2018-06-30 NOTE — Patient Instructions (Addendum)
Fever, Adult     A fever is an increase in your body's temperature. It often means a temperature of 100.4F (38C) or higher. Brief mild or moderate fevers often have no long-term effects. They often do not need treatment. Moderate or high fevers may make you feel uncomfortable. Sometimes, they can be a sign of a serious illness or disease. A fever that keeps coming back or that lasts a long time may cause you to lose water in your body (get dehydrated). You can take your temperature with a thermometer to see if you have a fever. Temperature can change with:  Age.  Time of day.  Where the thermometer is put in the body. Readings may vary when the thermometer is put: ? In the mouth (oral). ? In the butt (rectal). ? In the ear (tympanic). ? Under the arm (axillary). ? On the forehead (temporal). Follow these instructions at home: Medicines  Take over-the-counter and prescription medicines only as told by your doctor. Follow the dosing instructions carefully.  If you were prescribed an antibiotic medicine, take it as told by your doctor. Do not stop taking it even if you start to feel better. General instructions  Watch for any changes in your symptoms. Tell your doctor about them.  Rest as needed.  Drink enough fluid to keep your pee (urine) pale yellow.  Sponge yourself or bathe with room-temperature water as needed. This helps to lower your body temperature. Do not use ice water.  Do not use too many blankets or wear clothes that are too heavy.  If your fever was caused by an infection that spreads from person to person (is contagious), such as a cold or the flu: ? You should stay home from work and public places for at least 24 hours after your fever is gone. ? Your fever should be gone for at least 24 hours without the need to use medicines. Contact a doctor if:  You throw up (vomit).  You cannot eat or drink without throwing up.  You have watery poop (diarrhea).  It  hurts when you pee.  Your symptoms do not get better with treatment.  You have new symptoms.  You feel very weak. Get help right away if:  You are short of breath or have trouble breathing.  You are dizzy or you pass out (faint).  You feel mixed up (confused).  You have signs of not having enough water in your body, such as: ? Dark pee, very little pee, or no pee. ? Cracked lips. ? Dry mouth. ? Sunken eyes. ? Sleepiness. ? Weakness.  You have very bad pain in your belly (abdomen).  You keep throwing up or having watery poop.  You have a rash on your skin.  Your symptoms get worse all of a sudden. Summary  A fever is an increase in your body's temperature. It often means a temperature of 100.4F (38C) or higher.  Watch for any changes in your symptoms. Tell your doctor about them.  Take all medicines only as told by your doctor.  Do not go to work or other public places if your fever was caused by an illness that can spread to other people.  Get help right away if you have signs that you do not have enough water in your body. This information is not intended to replace advice given to you by your health care provider. Make sure you discuss any questions you have with your health care provider. Document Released: 02/28/2008   Document Revised: 11/04/2017 Document Reviewed: 11/04/2017 Elsevier Interactive Patient Education  2019 [employer].  Influenza, Adult Influenza is also called "the flu." It is an infection in the lungs, nose, and throat (respiratory tract). It is caused by a virus. The flu causes symptoms that are similar to symptoms of a cold. It also causes a high fever and body aches. The flu spreads easily from person to person (is contagious). Getting a flu shot (influenza vaccination) every year is the best way to prevent the flu. What are the causes? This condition is caused by the influenza virus. You can get the virus by:  Breathing in droplets that  are in the air from the cough or sneeze of a person who has the virus.  Touching something that has the virus on it (is contaminated) and then touching your mouth, nose, or eyes. What increases the risk? Certain things may make you more likely to get the flu. These include:  Not washing your hands often.  Having close contact with many people during cold and flu season.  Touching your mouth, eyes, or nose without first washing your hands.  Not getting a flu shot every year. You may have a higher risk for the flu, along with serious problems such as a lung infection (pneumonia), if you:  Are older than 65.  Are pregnant.  Have a weakened disease-fighting system (immune system) because of a disease or taking certain medicines.  Have a long-term (chronic) illness, such as: ? Heart, kidney, or lung disease. ? Diabetes. ? Asthma.  Have a liver disorder.  Are very overweight (morbidly obese).  Have anemia. This is a condition that affects your red blood cells. What are the signs or symptoms? Symptoms usually begin suddenly and last 4-14 days. They may include:  Fever and chills.  Headaches, body aches, or muscle aches.  Sore throat.  Cough.  Runny or stuffy (congested) nose.  Chest discomfort.  Not wanting to eat as much as normal (poor appetite).  Weakness or feeling tired (fatigue).  Dizziness.  Feeling sick to your stomach (nauseous) or throwing up (vomiting). How is this treated? If the flu is found early, you can be treated with medicine that can help reduce how bad the illness is and how long it lasts (antiviral medicine). This may be given by mouth (orally) or through an IV tube. Taking care of yourself at home can help your symptoms get better. Your doctor may suggest:  Taking over-the-counter medicines.  Drinking plenty of fluids. The flu often goes away on its own. If you have very bad symptoms or other problems, you may be treated in a  hospital. Follow these instructions at home:     Activity  Rest as needed. Get plenty of sleep.  Stay home from work or school as told by your doctor. ? Do not leave home until you do not have a fever for 24 hours without taking medicine. ? Leave home only to visit your doctor. Eating and drinking  Take an ORS (oral rehydration solution). This is a drink that is sold at pharmacies and stores.  Drink enough fluid to keep your pee (urine) pale yellow.  Drink clear fluids in small amounts as you are able. Clear fluids include: ? Water. ? Ice chips. ? Fruit juice that has water added (diluted fruit juice). ? Low-calorie sports drinks.  Eat bland, easy-to-digest foods in small amounts as you are able. These foods include: ? Bananas. ? Applesauce. ? Rice. ? Lean meats. ?  Toast. ? Crackers.  Do not eat or drink: ? Fluids that have a lot of sugar or caffeine. ? Alcohol. ? Spicy or fatty foods. General instructions  Take over-the-counter and prescription medicines only as told by your doctor.  Use a cool mist humidifier to add moisture to the air in your home. This can make it easier for you to breathe.  Cover your mouth and nose when you cough or sneeze.  Wash your hands with soap and water often, especially after you cough or sneeze. If you cannot use soap and water, use alcohol-based hand sanitizer.  Keep all follow-up visits as told by your doctor. This is important. How is this prevented?   Get a flu shot every year. You may get the flu shot in late summer, fall, or winter. Ask your doctor when you should get your flu shot.  Avoid contact with people who are sick during fall and winter (cold and flu season). Contact a doctor if:  You get new symptoms.  You have: ? Chest pain. ? Watery poop (diarrhea). ? A fever.  Your cough gets worse.  You start to have more mucus.  You feel sick to your stomach.  You throw up. Get help right away if you:  Have  shortness of breath.  Have trouble breathing.  Have skin or nails that turn a bluish color.  Have very bad pain or stiffness in your neck.  Get a sudden headache.  Get sudden pain in your face or ear.  Cannot eat or drink without throwing up. Summary  Influenza ("the flu") is an infection in the lungs, nose, and throat. It is caused by a virus.  Take over-the-counter and prescription medicines only as told by your doctor.  Getting a flu shot every year is the best way to avoid getting the flu. This information is not intended to replace advice given to you by your health care provider. Make sure you discuss any questions you have with your health care provider. Document Released: 02/28/2008 Document Revised: 11/06/2017 Document Reviewed: 11/06/2017 Elsevier Interactive Patient Education  2019 [employer]

## 2018-06-30 NOTE — Progress Notes (Signed)
Subjective:    Patient ID: Crystal Hall, female    DOB: [birth year], 58 y.o.   MRN: [id number]  Chief Complaint:  Dry cough, headache, body aches, chills (x 2 days; taking Advil and Nyquil)   HPI: Crystal Hall is a 58 y.o. female presenting on 06/30/2018 for Dry cough, headache, body aches, chills (x 2 days; taking Advil and Nyquil)  Pt presents today with complaints of fever, chills, body aches, sore throat, headaches, and cough. Pt states this started on Saturday night and became worse yesterday. States she was at [facility] with her mother over the weekend and may have been exposed to influenza. Pt states she has taken Advil with minimal relief of symptoms. She denies chest pain, shortness of breath, or productive cough. States she has been fatigued.   Relevant past medical, surgical, family, and social history reviewed and updated as indicated.  Allergies and medications reviewed and updated.   History reviewed. No pertinent past medical history.  History reviewed. No pertinent surgical history.  Social History   Socioeconomic History  . Marital status: Married    Spouse name: Not on file  . Number of children: Not on file  . Years of education: Not on file  . Highest education level: Not on file  Occupational History  . Not on file  Social Needs  . Financial resource strain: Not on file  . Food insecurity:    Worry: Not on file    Inability: Not on file  . Transportation needs:    Medical: Not on file    Non-medical: Not on file  Tobacco Use  . Smoking status: Never Smoker  . Smokeless tobacco: Never Used  Substance and Sexual Activity  . Alcohol use: Never    Frequency: Never  . Drug use: Never  . Sexual activity: Not on file  Lifestyle  . Physical activity:    Days per week: Not on file    Minutes per session: Not on file  . Stress: Not on file  Relationships  . Social connections:    Talks on phone: Not on file    Gets together: Not on file   Attends religious service: Not on file    Active member of club or organization: Not on file    Attends meetings of clubs or organizations: Not on file    Relationship status: Not on file  . Intimate partner violence:    Fear of current or ex partner: Not on file    Emotionally abused: Not on file    Physically abused: Not on file    Forced sexual activity: Not on file  Other Topics Concern  . Not on file  Social History Narrative  . Not on file    Outpatient Encounter Medications as of 06/30/2018  Medication Sig  . cetirizine (ZYRTEC) 10 MG tablet TAKE ONE TABLET BY MOUTH DAILY  . estrogen, conjugated,-medroxyprogesterone (PREMPRO) 0.3-1.5 MG tablet Take 1 tablet by mouth daily. (Needs to be seen before next refill)  . omega-3 acid ethyl esters (LOVAZA) 1 g capsule TAKE TWO CAPSULES BY MOUTH TWICE DAILY  . phentermine (ADIPEX-P) 37.5 MG tablet Take 1 tablet (37.5 mg total) by mouth daily before breakfast.  . simvastatin (ZOCOR) 80 MG tablet TAKE ONE TABLET BY MOUTH EVERY EVENING  . Vitamin D, Ergocalciferol, (DRISDOL) 50000 units CAPS capsule TAKE ONE CAPSULE BY MOUTH EVERY 7 DAYS  . oseltamivir (TAMIFLU) 75 MG capsule Take 1 capsule (75 mg total) by mouth 2 (  two) times daily for 5 days.   Facility-Administered Encounter Medications as of 06/30/2018  Medication  . acetaminophen (TYLENOL) tablet 500 mg    No Known Allergies  Review of Systems  Constitutional: Positive for chills, fatigue and fever.  HENT: Positive for congestion, rhinorrhea and sore throat.   Respiratory: Positive for cough. Negative for chest tightness and shortness of breath.   Cardiovascular: Negative for chest pain and palpitations.  Musculoskeletal: Positive for myalgias.  Neurological: Positive for headaches. Negative for dizziness and weakness.  Psychiatric/Behavioral: Negative for confusion.  All other systems reviewed and are negative.       Objective:    BP 135/87   Pulse (!) 106   Temp (!)  101.5 F (38.6 C) (Oral)   Ht _0  (1.499 m)   Wt 202 lb (91.6 kg)   BMI 40.80 kg/m    Wt Readings from Last 3 Encounters:  06/30/18 202 lb (91.6 kg)  11/29/17 214 lb 6.4 oz (97.3 kg)  08/20/17 210 lb (95.3 kg)    Physical Exam Vitals signs and nursing note reviewed.  Constitutional:      General: She is in acute distress (mild).     Appearance: She is well-developed and well-groomed.  HENT:     Head: Atraumatic.     Right Ear: Hearing, ear canal and external ear normal. A middle ear effusion is present. Tympanic membrane is not perforated or erythematous.     Left Ear: Hearing, ear canal and external ear normal. A middle ear effusion is present. Tympanic membrane is injected. Tympanic membrane is not perforated or erythematous.     Nose: Congestion present.     Right Turbinates: Swollen.     Left Turbinates: Swollen.     Right Sinus: No maxillary sinus tenderness or frontal sinus tenderness.     Left Sinus: No maxillary sinus tenderness or frontal sinus tenderness.     Mouth/Throat:     Lips: Pink.     Mouth: Mucous membranes are moist.     Pharynx: Posterior oropharyngeal erythema present. No pharyngeal swelling, oropharyngeal exudate or uvula swelling.     Tonsils: No tonsillar exudate or tonsillar abscesses.  Eyes:     General: Lids are normal.     Conjunctiva/sclera: Conjunctivae normal.     Pupils: Pupils are equal, round, and reactive to light.  Neck:     Musculoskeletal: Neck supple. No muscular tenderness.  Cardiovascular:     Rate and Rhythm: Normal rate and regular rhythm.     Heart sounds: Normal heart sounds. No murmur. No friction rub. No gallop.   Pulmonary:     Effort: Pulmonary effort is normal.     Breath sounds: Normal breath sounds.  Lymphadenopathy:     Cervical: No cervical adenopathy.  Skin:    General: Skin is warm and dry.     Capillary Refill: Capillary refill takes less than 2 seconds.  Neurological:     General: No focal deficit present.      Mental Status: She is alert and oriented to person, place, and time.  Psychiatric:        Mood and Affect: Mood normal.        Behavior: Behavior normal. Behavior is cooperative.        Thought Content: Thought content normal.        Judgment: Judgment normal.     Results for orders placed or performed in visit on 11/29/17  VITAMIN D 25 Hydroxy (Vit-D Deficiency, Fractures)  Result  Value Ref Range   Vit D, 25-Hydroxy 74.2 30.0 - 100.0 ng/mL  TSH  Result Value Ref Range   TSH 2.700 0.450 - 4.500 uIU/mL  Lipid panel  Result Value Ref Range   Cholesterol, Total 150 100 - 199 mg/dL   Triglycerides 133 0 - 149 mg/dL   HDL 41 >39 mg/dL   VLDL Cholesterol Cal 27 5 - 40 mg/dL   LDL Calculated 82 0 - 99 mg/dL   Chol/HDL Ratio 3.7 0.0 - 4.4 ratio  CMP14+EGFR  Result Value Ref Range   Glucose 103 (H) 65 - 99 mg/dL   BUN 16 6 - 24 mg/dL   Creatinine, Ser 0.85 0.57 - 1.00 mg/dL   GFR calc non Af Amer 77 >59 mL/min/1.73   GFR calc Af Amer 89 >59 mL/min/1.73   BUN/Creatinine Ratio 19 9 - 23   Sodium 141 134 - 144 mmol/L   Potassium 4.3 3.5 - 5.2 mmol/L   Chloride 102 96 - 106 mmol/L   CO2 24 20 - 29 mmol/L   Calcium 8.9 8.7 - 10.2 mg/dL   Total Protein 6.1 6.0 - 8.5 g/dL   Albumin 4.0 3.5 - 5.5 g/dL   Globulin, Total 2.1 1.5 - 4.5 g/dL   Albumin/Globulin Ratio 1.9 1.2 - 2.2   Bilirubin Total 0.3 0.0 - 1.2 mg/dL   Alkaline Phosphatase 87 39 - 117 IU/L   AST 12 0 - 40 IU/L   ALT 14 0 - 32 IU/L  CBC with Differential/Platelet  Result Value Ref Range   WBC 7.7 3.4 - 10.8 x10E3/uL   RBC 4.41 3.77 - 5.28 x10E6/uL   Hemoglobin 13.6 11.1 - 15.9 g/dL   Hematocrit 41.1 34.0 - 46.6 %   MCV 93 79 - 97 fL   MCH 30.8 26.6 - 33.0 pg   MCHC 33.1 31.5 - 35.7 g/dL   RDW 13.7 12.3 - 15.4 %   Platelets 246 150 - 450 x10E3/uL   Neutrophils 60 Not Estab. %   Lymphs 31 Not Estab. %   Monocytes 7 Not Estab. %   Eos 2 Not Estab. %   Basos 0 Not Estab. %   Neutrophils Absolute 4.6 1.4 - 7.0  x10E3/uL   Lymphocytes Absolute 2.4 0.7 - 3.1 x10E3/uL   Monocytes Absolute 0.5 0.1 - 0.9 x10E3/uL   EOS (ABSOLUTE) 0.2 0.0 - 0.4 x10E3/uL   Basophils Absolute 0.0 0.0 - 0.2 x10E3/uL   Immature Granulocytes 0 Not Estab. %   Immature Grans (Abs) 0.0 0.0 - 0.1 x10E3/uL     Influenza B positive  Pertinent labs & imaging results that were available during my care of the patient were reviewed by me and considered in my medical decision making.  Assessment & Plan:  Crystal Hall was seen today for dry cough, headache, body aches, chills.  Diagnoses and all orders for this visit:  Flu-like symptoms -     Veritor Flu A/B Waived  Influenza B Adequate hydration. Symptomatic care discussed. Medications as prescribed.  -     oseltamivir (TAMIFLU) 75 MG capsule; Take 1 capsule (75 mg total) by mouth 2 (two) times daily for 5 days.  Fever and chills Adequate hydration. Fever control with tylenol and motrin.  -     acetaminophen (TYLENOL) tablet 500 mg     Continue all other maintenance medications.  Follow up plan: Return if symptoms worsen or fail to improve.  Educational handout given for fever  The above assessment and management plan was discussed  with the patient. The patient verbalized understanding of and has agreed to the management plan. Patient is aware to call the clinic if symptoms persist or worsen. Patient is aware when to return to the clinic for a follow-up visit. Patient educated on when it is appropriate to go to the emergency department.   Monia Pouch, FNP-C [facility] [phone]

## 2018-07-06 ENCOUNTER — Other Ambulatory Visit: Payer: Self-pay | Admitting: Physician Assistant

## 2018-07-06 DIAGNOSIS — Z780 Asymptomatic menopausal state: Secondary | ICD-10-CM

## 2018-08-13 DIAGNOSIS — Z713 Dietary counseling and surveillance: Secondary | ICD-10-CM | POA: Diagnosis not present

## 2018-10-16 ENCOUNTER — Telehealth: Payer: Self-pay | Admitting: Physician Assistant

## 2018-11-19 DIAGNOSIS — Z713 Dietary counseling and surveillance: Secondary | ICD-10-CM | POA: Diagnosis not present

## 2018-11-20 ENCOUNTER — Other Ambulatory Visit: Payer: Self-pay | Admitting: Physician Assistant

## 2018-11-24 ENCOUNTER — Other Ambulatory Visit: Payer: Self-pay | Admitting: Physician Assistant

## 2018-11-27 ENCOUNTER — Other Ambulatory Visit: Payer: Self-pay | Admitting: Physician Assistant

## 2018-12-18 ENCOUNTER — Other Ambulatory Visit: Payer: Self-pay | Admitting: Physician Assistant

## 2018-12-18 NOTE — Telephone Encounter (Signed)
[facility]. NTBS 30 days given 11/24/18

## 2018-12-18 NOTE — Telephone Encounter (Signed)
Left message, please call to schedule an appointment for refills.

## 2019-01-12 DIAGNOSIS — Z713 Dietary counseling and surveillance: Secondary | ICD-10-CM | POA: Diagnosis not present

## 2019-02-17 ENCOUNTER — Other Ambulatory Visit: Payer: Self-pay | Admitting: Physician Assistant

## 2019-02-17 DIAGNOSIS — Z780 Asymptomatic menopausal state: Secondary | ICD-10-CM

## 2019-03-11 DIAGNOSIS — Z713 Dietary counseling and surveillance: Secondary | ICD-10-CM | POA: Diagnosis not present

## 2019-03-20 ENCOUNTER — Other Ambulatory Visit: Payer: Self-pay | Admitting: Physician Assistant

## 2019-03-20 DIAGNOSIS — Z780 Asymptomatic menopausal state: Secondary | ICD-10-CM

## 2019-03-20 NOTE — Telephone Encounter (Signed)
[facility] NTBS 3 days given 02/17/19

## 2019-03-20 NOTE — Telephone Encounter (Signed)
Lm for pt to call for appt

## 2019-03-31 DIAGNOSIS — L820 Inflamed seborrheic keratosis: Secondary | ICD-10-CM | POA: Diagnosis not present

## 2019-03-31 DIAGNOSIS — L821 Other seborrheic keratosis: Secondary | ICD-10-CM | POA: Diagnosis not present

## 2019-03-31 DIAGNOSIS — D485 Neoplasm of uncertain behavior of skin: Secondary | ICD-10-CM | POA: Diagnosis not present

## 2019-03-31 DIAGNOSIS — D225 Melanocytic nevi of trunk: Secondary | ICD-10-CM | POA: Diagnosis not present

## 2019-03-31 DIAGNOSIS — D239 Other benign neoplasm of skin, unspecified: Secondary | ICD-10-CM | POA: Diagnosis not present

## 2019-03-31 DIAGNOSIS — L819 Disorder of pigmentation, unspecified: Secondary | ICD-10-CM | POA: Diagnosis not present

## 2019-04-16 DIAGNOSIS — L988 Other specified disorders of the skin and subcutaneous tissue: Secondary | ICD-10-CM | POA: Diagnosis not present

## 2019-04-16 DIAGNOSIS — D485 Neoplasm of uncertain behavior of skin: Secondary | ICD-10-CM | POA: Diagnosis not present

## 2019-05-13 DIAGNOSIS — Z713 Dietary counseling and surveillance: Secondary | ICD-10-CM | POA: Diagnosis not present

## 2019-07-22 DIAGNOSIS — Z713 Dietary counseling and surveillance: Secondary | ICD-10-CM | POA: Diagnosis not present

## 2019-08-26 DIAGNOSIS — M722 Plantar fascial fibromatosis: Secondary | ICD-10-CM | POA: Diagnosis not present

## 2019-08-26 DIAGNOSIS — M79672 Pain in left foot: Secondary | ICD-10-CM | POA: Diagnosis not present

## 2019-09-11 ENCOUNTER — Other Ambulatory Visit: Payer: BC Managed Care – PPO

## 2019-09-11 ENCOUNTER — Other Ambulatory Visit: Payer: Self-pay

## 2019-09-11 DIAGNOSIS — Z0000 Encounter for general adult medical examination without abnormal findings: Secondary | ICD-10-CM | POA: Diagnosis not present

## 2019-09-12 LAB — CMP14+EGFR
ALT: 26 IU/L (ref 0–32)
AST: 20 IU/L (ref 0–40)
Albumin/Globulin Ratio: 1.9 (ref 1.2–2.2)
Albumin: 4.3 g/dL (ref 3.8–4.9)
Alkaline Phosphatase: 107 IU/L (ref 39–117)
BUN/Creatinine Ratio: 20 (ref 9–23)
BUN: 16 mg/dL (ref 6–24)
Bilirubin Total: 0.2 mg/dL (ref 0.0–1.2)
CO2: 25 mmol/L (ref 20–29)
Calcium: 9.5 mg/dL (ref 8.7–10.2)
Chloride: 102 mmol/L (ref 96–106)
Creatinine, Ser: 0.8 mg/dL (ref 0.57–1.00)
GFR calc Af Amer: 95 mL/min/{1.73_m2} (ref >59)
GFR calc non Af Amer: 82 mL/min/{1.73_m2} (ref >59)
Globulin, Total: 2.3 g/dL (ref 1.5–4.5)
Glucose: 100 mg/dL — ABNORMAL HIGH (ref 65–99)
Potassium: 4.2 mmol/L (ref 3.5–5.2)
Sodium: 140 mmol/L (ref 134–144)
Total Protein: 6.6 g/dL (ref 6.0–8.5)

## 2019-09-12 LAB — CBC WITH DIFFERENTIAL/PLATELET
Basophils Absolute: 0 10*3/uL (ref 0.0–0.2)
Basos: 1 %
EOS (ABSOLUTE): 0.2 10*3/uL (ref 0.0–0.4)
Eos: 3 %
Hematocrit: 41.1 % (ref 34.0–46.6)
Hemoglobin: 13.9 g/dL (ref 11.1–15.9)
Immature Grans (Abs): 0 10*3/uL (ref 0.0–0.1)
Immature Granulocytes: 0 %
Lymphocytes Absolute: 2.1 10*3/uL (ref 0.7–3.1)
Lymphs: 37 %
MCH: 31.2 pg (ref 26.6–33.0)
MCHC: 33.8 g/dL (ref 31.5–35.7)
MCV: 92 fL (ref 79–97)
Monocytes Absolute: 0.4 10*3/uL (ref 0.1–0.9)
Monocytes: 7 %
Neutrophils Absolute: 3 10*3/uL (ref 1.4–7.0)
Neutrophils: 52 %
Platelets: 237 10*3/uL (ref 150–450)
RBC: 4.46 x10E6/uL (ref 3.77–5.28)
RDW: 13.2 % (ref 11.7–15.4)
WBC: 5.7 10*3/uL (ref 3.4–10.8)

## 2019-09-12 LAB — THYROID PANEL WITH TSH
Free Thyroxine Index: 2.5 (ref 1.2–4.9)
T3 Uptake Ratio: 30 % (ref 24–39)
T4, Total: 8.3 ug/dL (ref 4.5–12.0)
TSH: 2.33 u[IU]/mL (ref 0.450–4.500)

## 2019-09-12 LAB — LIPID PANEL
Chol/HDL Ratio: 4.6 ratio — ABNORMAL HIGH (ref 0.0–4.4)
Cholesterol, Total: 194 mg/dL (ref 100–199)
HDL: 42 mg/dL (ref >39)
LDL Chol Calc (NIH): 114 mg/dL — ABNORMAL HIGH (ref 0–99)
Triglycerides: 219 mg/dL — ABNORMAL HIGH (ref 0–149)
VLDL Cholesterol Cal: 38 mg/dL (ref 5–40)

## 2019-09-12 LAB — VITAMIN D 25 HYDROXY (VIT D DEFICIENCY, FRACTURES): Vit D, 25-Hydroxy: 39.2 ng/mL (ref 30.0–100.0)

## 2019-09-14 ENCOUNTER — Encounter: Payer: BLUE CROSS/BLUE SHIELD | Admitting: Physician Assistant

## 2019-09-16 DIAGNOSIS — Z713 Dietary counseling and surveillance: Secondary | ICD-10-CM | POA: Diagnosis not present

## 2019-09-16 DIAGNOSIS — M79671 Pain in right foot: Secondary | ICD-10-CM | POA: Diagnosis not present

## 2019-09-16 DIAGNOSIS — M722 Plantar fascial fibromatosis: Secondary | ICD-10-CM | POA: Diagnosis not present

## 2019-09-17 ENCOUNTER — Ambulatory Visit (INDEPENDENT_AMBULATORY_CARE_PROVIDER_SITE_OTHER): Payer: BC Managed Care – PPO | Admitting: Nurse Practitioner

## 2019-09-17 ENCOUNTER — Ambulatory Visit (INDEPENDENT_AMBULATORY_CARE_PROVIDER_SITE_OTHER): Payer: BC Managed Care – PPO

## 2019-09-17 ENCOUNTER — Encounter: Payer: Self-pay | Admitting: Nurse Practitioner

## 2019-09-17 ENCOUNTER — Other Ambulatory Visit: Payer: Self-pay

## 2019-09-17 VITALS — BP 140/88 | HR 81 | Temp 98.1°F | Resp 20 | Ht 59.0 in | Wt 212.0 lb

## 2019-09-17 DIAGNOSIS — Z1211 Encounter for screening for malignant neoplasm of colon: Secondary | ICD-10-CM | POA: Diagnosis not present

## 2019-09-17 DIAGNOSIS — Z1212 Encounter for screening for malignant neoplasm of rectum: Secondary | ICD-10-CM

## 2019-09-17 DIAGNOSIS — E559 Vitamin D deficiency, unspecified: Secondary | ICD-10-CM

## 2019-09-17 DIAGNOSIS — Z0001 Encounter for general adult medical examination with abnormal findings: Secondary | ICD-10-CM

## 2019-09-17 DIAGNOSIS — Z23 Encounter for immunization: Secondary | ICD-10-CM | POA: Diagnosis not present

## 2019-09-17 DIAGNOSIS — Z0000 Encounter for general adult medical examination without abnormal findings: Secondary | ICD-10-CM

## 2019-09-17 DIAGNOSIS — R635 Abnormal weight gain: Secondary | ICD-10-CM

## 2019-09-17 DIAGNOSIS — E782 Mixed hyperlipidemia: Secondary | ICD-10-CM

## 2019-09-17 DIAGNOSIS — Z139 Encounter for screening, unspecified: Secondary | ICD-10-CM | POA: Diagnosis not present

## 2019-09-17 DIAGNOSIS — E785 Hyperlipidemia, unspecified: Secondary | ICD-10-CM

## 2019-09-17 MED ORDER — OMEGA-3-ACID ETHYL ESTERS 1 G PO CAPS: 2.0000 | ORAL_CAPSULE | Freq: Two times a day (BID) | ORAL | 1 refills | Status: AC

## 2019-09-17 MED ORDER — PHENTERMINE HCL 37.5 MG PO TABS: 37.5000 mg | ORAL_TABLET | Freq: Every day | ORAL | 2 refills | Status: AC

## 2019-09-17 MED ORDER — SIMVASTATIN 80 MG PO TABS: 80.0000 mg | ORAL_TABLET | Freq: Every evening | ORAL | 0 refills | Status: DC

## 2019-09-17 MED ORDER — DESOXIMETASONE 0.25 % EX CREA: 1.0000 "application " | TOPICAL_CREAM | Freq: Two times a day (BID) | CUTANEOUS | 0 refills | Status: AC

## 2019-09-17 MED ORDER — VITAMIN D (ERGOCALCIFEROL) 1.25 MG (50000 UNIT) PO CAPS: ORAL_CAPSULE | ORAL | 3 refills | Status: AC

## 2019-09-17 NOTE — Patient Instructions (Signed)

## 2019-09-17 NOTE — Progress Notes (Signed)
Subjective:    Patient ID: Crystal Hall, female    DOB: 1960-11-18, 59 y.o.   MRN: [id number]   Chief Complaint: Annual Exam    HPI:  1. Annual physical exam  2. Mixed hyperlipidemia She is currently on zocor daily but has ran out. Does not really watch diet and does little to no exercise. Lab Results  Component Value Date   CHOL 194 09/11/2019   HDL 42 09/11/2019   LDLCALC 114 (H) 09/11/2019   TRIG 219 (H) 09/11/2019   CHOLHDL 4.6 (H) 09/11/2019     3. Vitamin D deficiency Has not been on vitamin d lately because she ran out  4. Obesity Weight is up 10 lbs since last visit. She has been going to [employer] and exercising. She would like rx of adipex.  Wt Readings from Last 3 Encounters:  09/17/19 212 lb (96.2 kg)  06/30/18 202 lb (91.6 kg)  11/29/17 214 lb 6.4 oz (97.3 kg)   BMI Readings from Last 3 Encounters:  09/17/19 42.82 kg/m  06/30/18 40.80 kg/m  11/29/17 43.30 kg/m     Outpatient Encounter Medications as of 09/17/2019  Medication Sig  . cetirizine (ZYRTEC) 10 MG tablet TAKE 1 TABLET BY MOUTH DAILY  . omega-3 acid ethyl esters (LOVAZA) 1 g capsule TAKE TWO CAPSULES BY MOUTH TWICE DAILY  . simvastatin (ZOCOR) 80 MG tablet Take 1 tablet (80 mg total) by mouth every evening. (Needs to be seen before next refill)  . estrogen, conjugated,-medroxyprogesterone (PREMPRO) 0.3-1.5 MG tablet Take 1 tablet by mouth daily. (Needs to be seen before next refill) (Patient not taking: Reported on 09/17/2019)  . Vitamin D, Ergocalciferol, (DRISDOL) 1.25 MG (50000 UT) CAPS capsule TAKE ONE CAPSULE BY MOUTH EVERY 7 DAYS (Patient not taking: Reported on 09/17/2019)     Past Surgical History:  Procedure Laterality Date  . skin cancer to back  2020    Family History  Problem Relation Age of Onset  . Hypertension Father   . Lung cancer Father     New complaints: None today  Social history: Lives with her husband and mother in law  Controlled substance  contract: n/a    Review of Systems  Constitutional: Negative for diaphoresis.  Eyes: Negative for pain.  Respiratory: Negative for shortness of breath.   Cardiovascular: Negative for chest pain, palpitations and leg swelling.  Gastrointestinal: Negative for abdominal pain.  Endocrine: Negative for polydipsia.  Skin: Negative for rash.  Neurological: Negative for dizziness, weakness and headaches.  Hematological: Does not bruise/bleed easily.  All other systems reviewed and are negative.      Objective:   Physical Exam Vitals and nursing note reviewed.  Constitutional:      General: She is not in acute distress.    Appearance: Normal appearance. She is well-developed.  HENT:     Head: Normocephalic.     Nose: Nose normal.  Eyes:     Pupils: Pupils are equal, round, and reactive to light.  Neck:     Vascular: No carotid bruit or JVD.  Cardiovascular:     Rate and Rhythm: Normal rate and regular rhythm.     Heart sounds: Normal heart sounds.  Pulmonary:     Effort: Pulmonary effort is normal. No respiratory distress.     Breath sounds: Normal breath sounds. No wheezing or rales.  Chest:     Chest wall: No tenderness.  Abdominal:     General: Bowel sounds are normal. There is no distension or  abdominal bruit.     Palpations: Abdomen is soft. There is no hepatomegaly, splenomegaly, mass or pulsatile mass.     Tenderness: There is no abdominal tenderness.  Genitourinary:    General: Normal vulva.     Vagina: No vaginal discharge.     Rectum: Normal.     Comments: No adnexal masses or tenderness  cervix nonparous and pink Musculoskeletal:        General: Normal range of motion.     Cervical back: Normal range of motion and neck supple.  Lymphadenopathy:     Cervical: No cervical adenopathy.  Skin:    General: Skin is warm and dry.  Neurological:     Mental Status: She is alert and oriented to person, place, and time.     Deep Tendon Reflexes: Reflexes are normal and  symmetric.  Psychiatric:        Behavior: Behavior normal.        Thought Content: Thought content normal.        Judgment: Judgment normal.    BP 140/88   Pulse 81   Temp 98.1 F (36.7 C) (Temporal)   Resp 20   Ht 4\' 11"  (1.499 m)   Wt 212 lb (96.2 kg)   SpO2 97%   BMI 42.82 kg/m   EKG- NSR-Crystal Hassell Done, FNP  Chest x ray- no cardiopulmonary abnromalities      Assessment & Plan:  [name] comes in today with chief complaint of Annual Exam   Diagnosis and orders addressed:  1. Annual physical exam - IGP,CtNgTv,Apt HPV - EKG 12-Lead - DG Chest 2 View; Future  2. Mixed hyperlipidemia Low fat diet - simvastatin (ZOCOR) 80 MG tablet; Take 1 tablet (80 mg total) by mouth every evening. (Needs to be seen before next refill)  Dispense: 30 tablet; Refill: 0 - omega-3 acid ethyl esters (LOVAZA) 1 g capsule; Take 2 capsules (2 g total) by mouth 2 (two) times daily.  Dispense: 120 capsule; Refill: 1  3. Vitamin D deficiency Back on daily vitamin d - Vitamin D, Ergocalciferol, (DRISDOL) 1.25 MG (50000 UNIT) CAPS capsule; TAKE ONE CAPSULE BY MOUTH EVERY 7 DAYS  Dispense: 4 capsule; Refill: 3  4. Encounter for screening for colorectal malignant neoplasm - Cologuard  5. Weight gain Discussed diet and exercise for person with BMI >25 Will recheck weight in 3-6 months - phentermine (ADIPEX-P) 37.5 MG tablet; Take 1 tablet (37.5 mg total) by mouth daily before breakfast.  Dispense: 30 tablet; Refill: 2   Labs pending Health Maintenance reviewed Diet and exercise encouraged  Follow up plan: 6 months   Crystal Hassell Done, FNP

## 2019-09-18 NOTE — Addendum Note (Signed)
Addended by: Cleda Daub on: 09/18/2019 03:14 PM   Modules accepted: Orders

## 2019-09-22 LAB — IGP,CTNGTV,APT HPV
Chlamydia, Nuc. Acid Amp: NEGATIVE
Gonococcus, Nuc. Acid Amp: NEGATIVE
HPV Aptima: NEGATIVE
Trich vag by NAA: NEGATIVE

## 2019-10-07 DIAGNOSIS — M79672 Pain in left foot: Secondary | ICD-10-CM | POA: Diagnosis not present

## 2019-10-07 DIAGNOSIS — M722 Plantar fascial fibromatosis: Secondary | ICD-10-CM | POA: Diagnosis not present

## 2019-10-14 DIAGNOSIS — I781 Nevus, non-neoplastic: Secondary | ICD-10-CM | POA: Diagnosis not present

## 2019-10-14 DIAGNOSIS — L570 Actinic keratosis: Secondary | ICD-10-CM | POA: Diagnosis not present

## 2019-10-22 DIAGNOSIS — Z23 Encounter for immunization: Secondary | ICD-10-CM | POA: Diagnosis not present

## 2019-11-05 ENCOUNTER — Other Ambulatory Visit: Payer: Self-pay | Admitting: Nurse Practitioner

## 2019-11-05 DIAGNOSIS — E782 Mixed hyperlipidemia: Secondary | ICD-10-CM

## 2019-11-11 DIAGNOSIS — Z713 Dietary counseling and surveillance: Secondary | ICD-10-CM | POA: Diagnosis not present

## 2019-11-17 DIAGNOSIS — Z23 Encounter for immunization: Secondary | ICD-10-CM | POA: Diagnosis not present

## 2019-12-02 ENCOUNTER — Other Ambulatory Visit: Payer: Self-pay | Admitting: *Deleted

## 2019-12-02 MED ORDER — CETIRIZINE HCL 10 MG PO TABS: 10.0000 mg | ORAL_TABLET | Freq: Every day | ORAL | 11 refills | Status: AC

## 2019-12-09 ENCOUNTER — Other Ambulatory Visit: Payer: Self-pay | Admitting: Nurse Practitioner

## 2019-12-09 DIAGNOSIS — R635 Abnormal weight gain: Secondary | ICD-10-CM

## 2019-12-25 ENCOUNTER — Other Ambulatory Visit: Payer: Self-pay | Admitting: Nurse Practitioner

## 2019-12-25 DIAGNOSIS — E559 Vitamin D deficiency, unspecified: Secondary | ICD-10-CM

## 2020-01-01 DIAGNOSIS — Z1231 Encounter for screening mammogram for malignant neoplasm of breast: Secondary | ICD-10-CM | POA: Diagnosis not present

## 2020-01-06 DIAGNOSIS — Z713 Dietary counseling and surveillance: Secondary | ICD-10-CM | POA: Diagnosis not present

## 2020-03-09 DIAGNOSIS — Z713 Dietary counseling and surveillance: Secondary | ICD-10-CM | POA: Diagnosis not present

## 2020-04-19 DIAGNOSIS — L570 Actinic keratosis: Secondary | ICD-10-CM | POA: Diagnosis not present

## 2020-04-19 DIAGNOSIS — I781 Nevus, non-neoplastic: Secondary | ICD-10-CM | POA: Diagnosis not present

## 2020-05-11 DIAGNOSIS — Z713 Dietary counseling and surveillance: Secondary | ICD-10-CM | POA: Diagnosis not present

## 2020-05-26 ENCOUNTER — Other Ambulatory Visit: Payer: Self-pay | Admitting: Nurse Practitioner

## 2020-05-26 DIAGNOSIS — E782 Mixed hyperlipidemia: Secondary | ICD-10-CM

## 2020-07-13 DIAGNOSIS — Z713 Dietary counseling and surveillance: Secondary | ICD-10-CM | POA: Diagnosis not present

## 2020-07-25 ENCOUNTER — Other Ambulatory Visit: Payer: Self-pay | Admitting: Nurse Practitioner

## 2020-07-25 DIAGNOSIS — E782 Mixed hyperlipidemia: Secondary | ICD-10-CM

## 2020-07-25 NOTE — Telephone Encounter (Signed)
MMM NTBS 30 days given 05/26/20 Last OV 09/17/19

## 2020-07-26 NOTE — Telephone Encounter (Signed)
Patient will cb tomorrow to make appt

## 2020-08-12 DIAGNOSIS — E559 Vitamin D deficiency, unspecified: Secondary | ICD-10-CM | POA: Diagnosis not present

## 2020-08-12 DIAGNOSIS — E7801 Familial hypercholesterolemia: Secondary | ICD-10-CM | POA: Diagnosis not present

## 2020-08-12 DIAGNOSIS — J302 Other seasonal allergic rhinitis: Secondary | ICD-10-CM | POA: Diagnosis not present

## 2020-08-12 DIAGNOSIS — Z6841 Body Mass Index (BMI) 40.0 and over, adult: Secondary | ICD-10-CM | POA: Diagnosis not present

## 2020-08-19 DIAGNOSIS — Z1331 Encounter for screening for depression: Secondary | ICD-10-CM | POA: Diagnosis not present

## 2020-08-19 DIAGNOSIS — J302 Other seasonal allergic rhinitis: Secondary | ICD-10-CM | POA: Diagnosis not present

## 2020-08-19 DIAGNOSIS — Z1389 Encounter for screening for other disorder: Secondary | ICD-10-CM | POA: Diagnosis not present

## 2020-08-19 DIAGNOSIS — R7303 Prediabetes: Secondary | ICD-10-CM | POA: Diagnosis not present

## 2020-08-19 DIAGNOSIS — E559 Vitamin D deficiency, unspecified: Secondary | ICD-10-CM | POA: Diagnosis not present

## 2020-08-19 DIAGNOSIS — E7801 Familial hypercholesterolemia: Secondary | ICD-10-CM | POA: Diagnosis not present

## 2020-08-30 DIAGNOSIS — E559 Vitamin D deficiency, unspecified: Secondary | ICD-10-CM | POA: Diagnosis not present

## 2020-08-30 DIAGNOSIS — Z6841 Body Mass Index (BMI) 40.0 and over, adult: Secondary | ICD-10-CM | POA: Diagnosis not present

## 2020-08-30 DIAGNOSIS — E7800 Pure hypercholesterolemia, unspecified: Secondary | ICD-10-CM | POA: Diagnosis not present

## 2020-09-28 DIAGNOSIS — Z713 Dietary counseling and surveillance: Secondary | ICD-10-CM | POA: Diagnosis not present

## 2020-11-29 DIAGNOSIS — J302 Other seasonal allergic rhinitis: Secondary | ICD-10-CM | POA: Diagnosis not present

## 2020-11-29 DIAGNOSIS — R7303 Prediabetes: Secondary | ICD-10-CM | POA: Diagnosis not present

## 2020-11-29 DIAGNOSIS — Z6841 Body Mass Index (BMI) 40.0 and over, adult: Secondary | ICD-10-CM | POA: Diagnosis not present

## 2020-11-29 DIAGNOSIS — E7801 Familial hypercholesterolemia: Secondary | ICD-10-CM | POA: Diagnosis not present

## 2020-12-01 DIAGNOSIS — L237 Allergic contact dermatitis due to plants, except food: Secondary | ICD-10-CM | POA: Diagnosis not present

## 2021-02-28 DIAGNOSIS — Z23 Encounter for immunization: Secondary | ICD-10-CM | POA: Diagnosis not present

## 2021-02-28 DIAGNOSIS — J302 Other seasonal allergic rhinitis: Secondary | ICD-10-CM | POA: Diagnosis not present

## 2021-02-28 DIAGNOSIS — R7303 Prediabetes: Secondary | ICD-10-CM | POA: Diagnosis not present

## 2021-02-28 DIAGNOSIS — E7801 Familial hypercholesterolemia: Secondary | ICD-10-CM | POA: Diagnosis not present

## 2021-02-28 DIAGNOSIS — Z6839 Body mass index (BMI) 39.0-39.9, adult: Secondary | ICD-10-CM | POA: Diagnosis not present

## 2021-04-19 DIAGNOSIS — L570 Actinic keratosis: Secondary | ICD-10-CM | POA: Diagnosis not present

## 2021-04-19 DIAGNOSIS — D239 Other benign neoplasm of skin, unspecified: Secondary | ICD-10-CM | POA: Diagnosis not present

## 2022-03-03 IMAGING — DX DG CHEST 2V
2 series · 2 of 2 positions shown · non-contrast
Comparison: None.

CLINICAL DATA: Screening

EXAM:
CHEST - 2 VIEW

[chest pa]
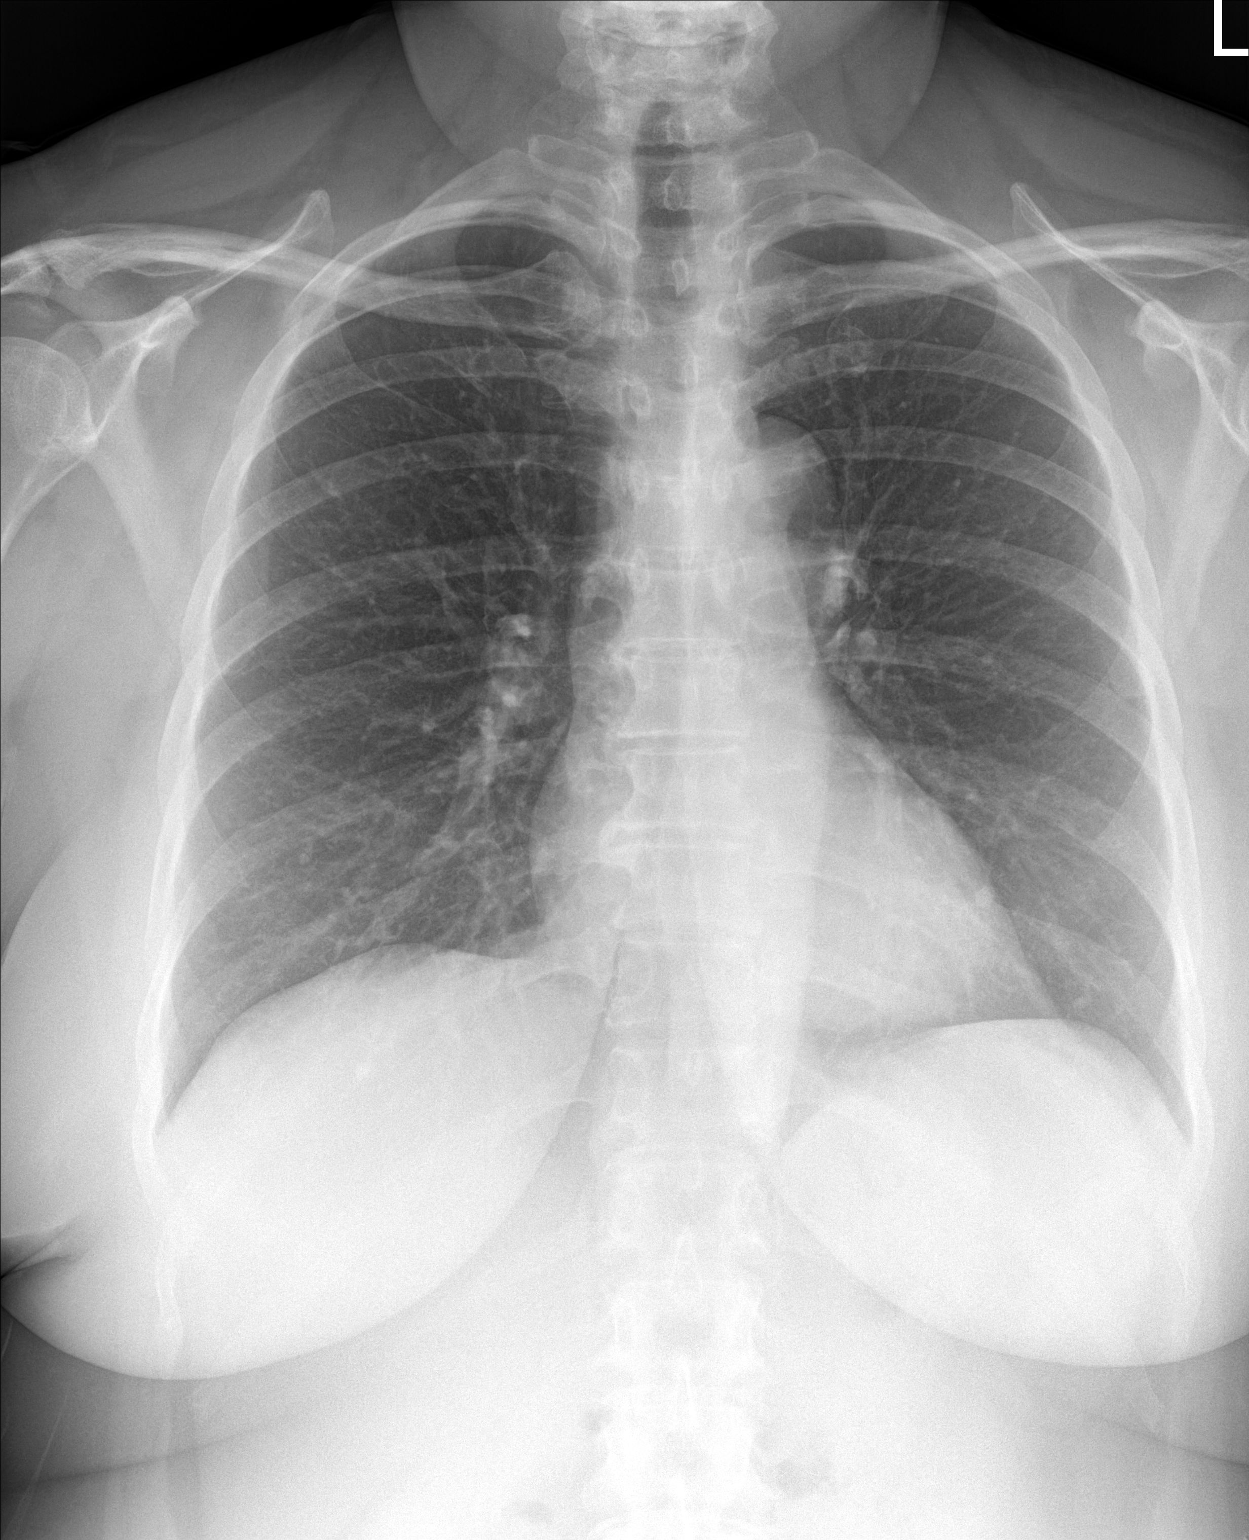

[chest lat]
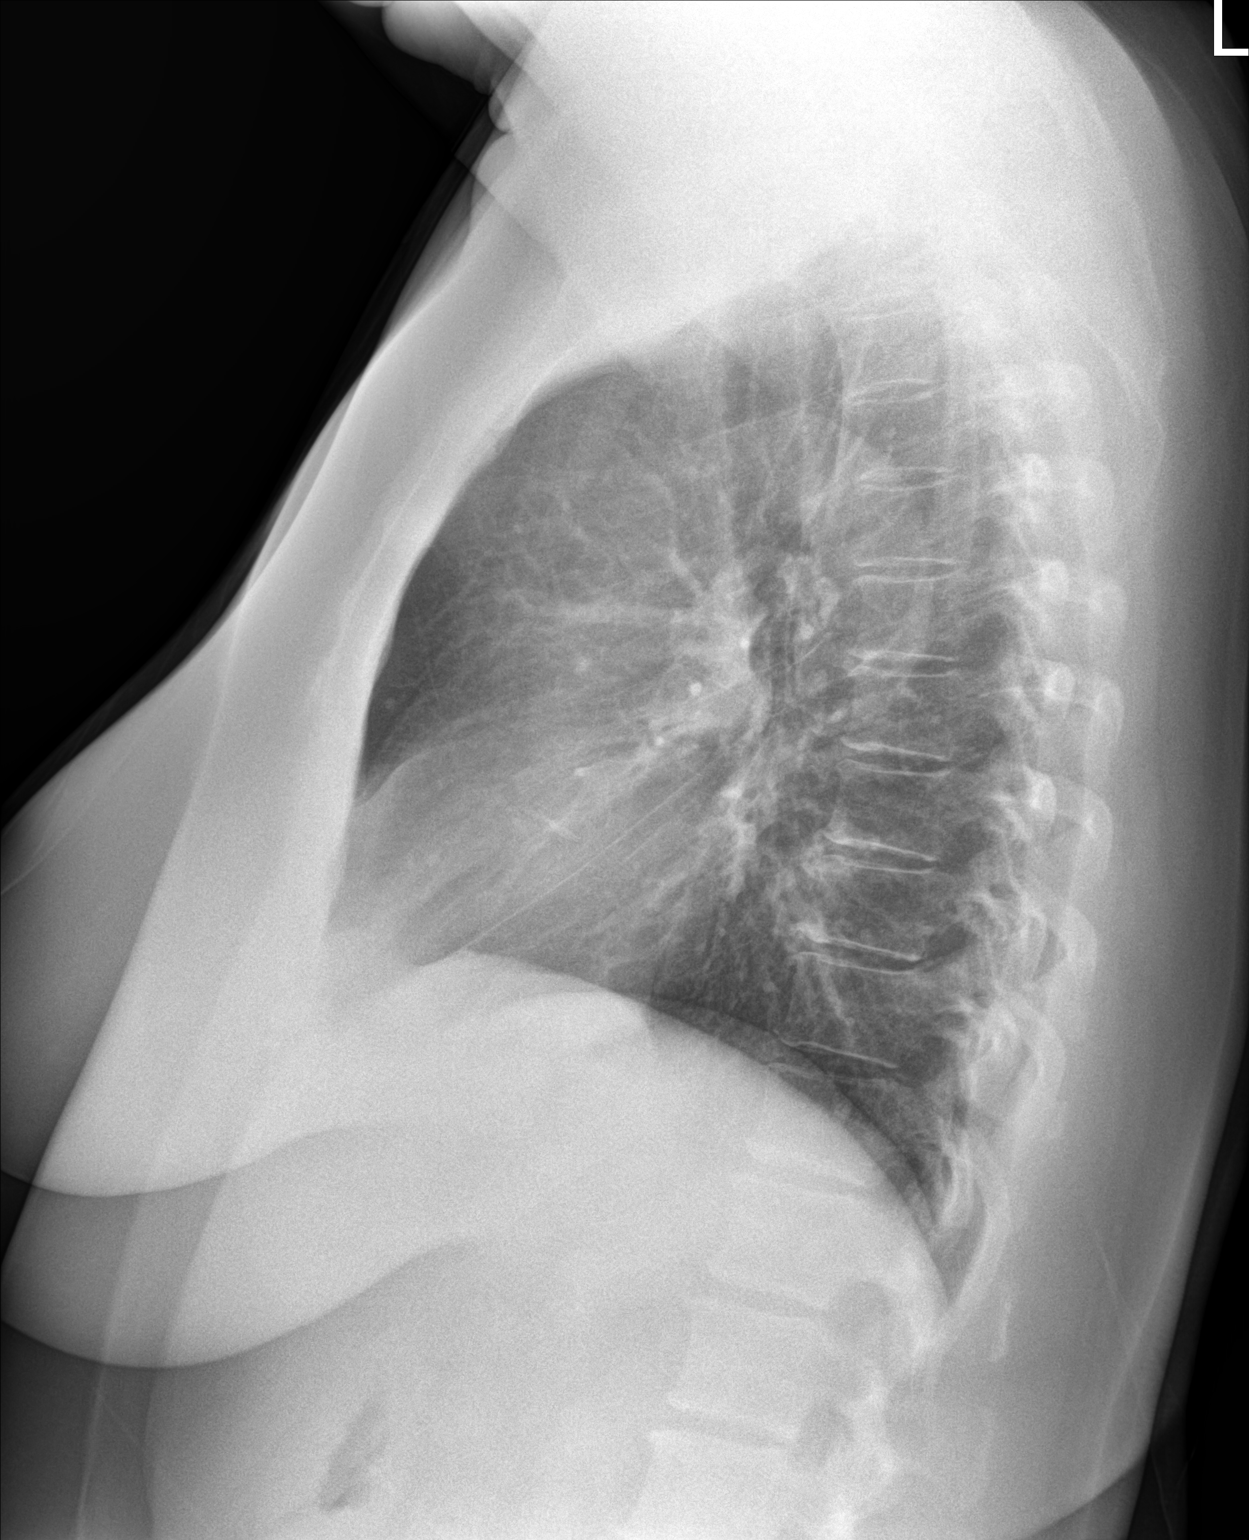

[2 of 2 positions shown; findings below may reference images not displayed]

FINDINGS: The heart size and mediastinal contours are within normal limits.
Both lungs are clear. The visualized skeletal structures are
unremarkable.
IMPRESSION: No active cardiopulmonary disease.
